# Patient Record
Sex: Female | Born: 1980 | Race: White | Hispanic: No | Smoking: Current every day smoker
Health system: Southern US, Community
[De-identification: ages and names within clinical notes are randomized; demographics above are authoritative.]

## PROBLEM LIST (undated history)

## (undated) DIAGNOSIS — Z87442 Personal history of urinary calculi: Secondary | ICD-10-CM

## (undated) DIAGNOSIS — F32A Depression, unspecified: Secondary | ICD-10-CM

## (undated) DIAGNOSIS — F319 Bipolar disorder, unspecified: Secondary | ICD-10-CM

## (undated) DIAGNOSIS — F209 Schizophrenia, unspecified: Secondary | ICD-10-CM

## (undated) DIAGNOSIS — D649 Anemia, unspecified: Secondary | ICD-10-CM

## (undated) DIAGNOSIS — F329 Major depressive disorder, single episode, unspecified: Secondary | ICD-10-CM

## (undated) HISTORY — PX: DIAGNOSTIC LAPAROSCOPY: SUR761

## (undated) HISTORY — PX: ABDOMINAL HYSTERECTOMY: SHX81

## (undated) HISTORY — PX: CHOLECYSTECTOMY: SHX55

---

## 2014-07-28 ENCOUNTER — Emergency Department: Payer: Self-pay | Admitting: Emergency Medicine

## 2014-09-01 ENCOUNTER — Emergency Department: Admit: 2014-09-01 | Discharge status: Against Medical Advice | Payer: Self-pay | Admitting: Emergency Medicine

## 2014-09-01 LAB — CBC
HCT: 40.8 % (ref 35.0–47.0)
HGB: 13.4 g/dL (ref 12.0–16.0)
MCH: 29.3 pg (ref 26.0–34.0)
MCHC: 33 g/dL (ref 32.0–36.0)
MCV: 89 fL (ref 80–100)
Platelet: 303 10*3/uL (ref 150–440)
RBC: 4.58 10*6/uL (ref 3.80–5.20)
RDW: 13.6 % (ref 11.5–14.5)
WBC: 12.1 10*3/uL — ABNORMAL HIGH (ref 3.6–11.0)

## 2014-09-01 LAB — COMPREHENSIVE METABOLIC PANEL
ANION GAP: 6 — AB (ref 7–16)
AST: 20 U/L
Albumin: 4 g/dL
Alkaline Phosphatase: 82 U/L
BUN: 12 mg/dL
Bilirubin,Total: 0.4 mg/dL
CALCIUM: 8.6 mg/dL — AB
CHLORIDE: 104 mmol/L
Co2: 26 mmol/L
Creatinine: 0.83 mg/dL
EGFR (Non-African Amer.): >60
GLUCOSE: 101 mg/dL — AB
Potassium: 3.8 mmol/L
SGPT (ALT): 26 U/L
Sodium: 136 mmol/L
Total Protein: 6.9 g/dL

## 2014-09-01 LAB — URINALYSIS, COMPLETE
BACTERIA: NONE SEEN
Bilirubin,UR: NEGATIVE
Blood: NEGATIVE
GLUCOSE, UR: NEGATIVE mg/dL (ref 0–75)
Ketone: NEGATIVE
Leukocyte Esterase: NEGATIVE
Nitrite: NEGATIVE
PROTEIN: NEGATIVE
Ph: 7 (ref 4.5–8.0)
SPECIFIC GRAVITY: 1.012 (ref 1.003–1.030)

## 2014-09-24 ENCOUNTER — Emergency Department
Admission: EM | Admit: 2014-09-24 | Discharge: 2014-09-24 | Disposition: A | Discharge status: Home / Self Care | Payer: Medicare Other | Attending: Emergency Medicine | Admitting: Emergency Medicine

## 2014-09-24 DIAGNOSIS — Z880 Allergy status to penicillin: Secondary | ICD-10-CM | POA: Insufficient documentation

## 2014-09-24 DIAGNOSIS — B029 Zoster without complications: Secondary | ICD-10-CM | POA: Diagnosis not present

## 2014-09-24 DIAGNOSIS — R21 Rash and other nonspecific skin eruption: Secondary | ICD-10-CM | POA: Diagnosis present

## 2014-09-24 MED ORDER — HYDROCODONE-ACETAMINOPHEN 5-325 MG PO TABS: 1.0000 | ORAL_TABLET | ORAL | Status: DC | PRN

## 2014-09-24 MED ORDER — ACYCLOVIR 400 MG PO TABS: 800.0000 mg | ORAL_TABLET | Freq: Every day | ORAL | Status: DC

## 2014-09-24 MED ORDER — BETAMETHASONE DIPROPIONATE AUG 0.05 % EX OINT: TOPICAL_OINTMENT | Freq: Two times a day (BID) | CUTANEOUS | Status: DC

## 2014-09-24 MED ORDER — METHYLPREDNISOLONE 4 MG PO TBPK: ORAL_TABLET | ORAL | Status: DC

## 2014-09-24 NOTE — ED Notes (Signed)
Pt states she started back on lamictal  Monday night and states yesterday she began to have red raised rash that is itching and buring, states rash has spread from neck to abd. area

## 2014-09-24 NOTE — ED Notes (Signed)
Restarted lamitical and now has scattered rash

## 2014-09-24 NOTE — Discharge Instructions (Signed)
Shingles Shingles (herpes zoster) is an infection that is caused by the same virus that causes chickenpox (varicella). The infection causes a painful skin rash and fluid-filled blisters, which eventually break open, crust over, and heal. It may occur in any area of the body, but it usually affects only one side of the body or face. The pain of shingles usually lasts about 1 month. However, some people with shingles may develop long-term (chronic) pain in the affected area of the body. Shingles often occurs many years after the person had chickenpox. It is more common:  In people older than 50 years.  In people with weakened immune systems, such as those with HIV, AIDS, or cancer.  In people taking medicines that weaken the immune system, such as transplant medicines.  In people under great stress. CAUSES  Shingles is caused by the varicella zoster virus (VZV), which also causes chickenpox. After a person is infected with the virus, it can remain in the person's body for years in an inactive state (dormant). To cause shingles, the virus reactivates and breaks out as an infection in a nerve root. The virus can be spread from person to person (contagious) through contact with open blisters of the shingles rash. It will only spread to people who have not had chickenpox. When these people are exposed to the virus, they may develop chickenpox. They will not develop shingles. Once the blisters scab over, the person is no longer contagious and cannot spread the virus to others. SIGNS AND SYMPTOMS  Shingles shows up in stages. The initial symptoms may be pain, itching, and tingling in an area of the skin. This pain is usually described as burning, stabbing, or throbbing.In a few days or weeks, a painful red rash will appear in the area where the pain, itching, and tingling were felt. The rash is usually on one side of the body in a band or belt-like pattern. Then, the rash usually turns into fluid-filled  blisters. They will scab over and dry up in approximately 2-3 weeks. Flu-like symptoms may also occur with the initial symptoms, the rash, or the blisters. These may include:  Fever.  Chills.  Headache.  Upset stomach. DIAGNOSIS  Your health care provider will perform a skin exam to diagnose shingles. Skin scrapings or fluid samples may also be taken from the blisters. This sample will be examined under a microscope or sent to a lab for further testing. TREATMENT  There is no specific cure for shingles. Your health care provider will likely prescribe medicines to help you manage the pain, recover faster, and avoid long-term problems. This may include antiviral drugs, anti-inflammatory drugs, and pain medicines. HOME CARE INSTRUCTIONS   Take a cool bath or apply cool compresses to the area of the rash or blisters as directed. This may help with the pain and itching.   Take medicines only as directed by your health care provider.   Rest as directed by your health care provider.  Keep your rash and blisters clean with mild soap and cool water or as directed by your health care provider.  Do not pick your blisters or scratch your rash. Apply an anti-itch cream or numbing creams to the affected area as directed by your health care provider.  Keep your shingles rash covered with a loose bandage (dressing).  Avoid skin contact with:  Babies.   Pregnant women.   Children with eczema.   Elderly people with transplants.   People with chronic illnesses, such as leukemia  or AIDS.   Wear loose-fitting clothing to help ease the pain of material rubbing against the rash.  Keep all follow-up visits as directed by your health care provider.If the area involved is on your face, you may receive a referral for a specialist, such as an eye doctor (ophthalmologist) or an ear, nose, and throat (ENT) doctor. Keeping all follow-up visits will help you avoid eye problems, chronic pain, or  disability.  SEEK IMMEDIATE MEDICAL CARE IF:   You have facial pain, pain around the eye area, or loss of feeling on one side of your face.  You have ear pain or ringing in your ear.  You have loss of taste.  Your pain is not relieved with prescribed medicines.   Your redness or swelling spreads.   You have more pain and swelling.  Your condition is worsening or has changed.   You have a fever. MAKE SURE YOU:  Understand these instructions.  Will watch your condition.  Will get help right away if you are not doing well or get worse. Document Released: 05/09/2005 Document Revised: 09/23/2013 Document Reviewed: 12/22/2011 [facility] Patient Information 2015 [place]. This information is not intended to replace advice given to you by your health care provider. Make sure you discuss any questions you have with your health care provider.   You have an outbreak of shingles.  It has happened because you have had chicken pox as a child.  You may have had this flare, because we think, sometimes the virus shows up at times when the body is stressed.  For you, this may have happened because you were experiencing withdrawal symptoms from your Lamictal.  You are not contagious to anyone.  Take the prescription meds as directed.  Your rash will get worse, before it gets better.  [facility].

## 2014-09-24 NOTE — ED Provider Notes (Signed)
[facility] Emergency Department Provider Note   ____________________________________________  Time seen: 1429 I have reviewed the triage vital signs and the nursing notes.   HISTORY  Chief Complaint Medication Reaction  HPI Taylor SpurrMaria Fleming is a 34 y.o. female who recently relocated to the area from out of town. She is here because she started her Lamictal, on Monday after being out for about a week. She describes she had begun to experience some withdrawal symptoms, including nausea, vomiting, and diarrhea.She is here because she awoke with a painful burning rash to the left neck 1 day prior to arrival. She called her provider who assumed she was having a medicine reaction to the Lamictal, and advised that she discontinue use immediately. She is here today for evaluation of rash.   No past medical history on file.  There are no active problems to display for this patient.   No past surgical history on file.  Current Outpatient Rx  Name  Route  Sig  Dispense  Refill  . acyclovir (ZOVIRAX) 400 MG tablet   Oral   Take 2 tablets (800 mg total) by mouth 5 (five) times daily.   70 tablet   0   . augmented betamethasone dipropionate (DIPROLENE-AF) 0.05 % ointment   Topical   Apply topically 2 (two) times daily.   30 g   0   . HYDROcodone-acetaminophen (NORCO) 5-325 MG per tablet   Oral   Take 1 tablet by mouth every 4 (four) hours as needed for moderate pain.   6 tablet   0   . methylPREDNISolone (MEDROL DOSEPAK) 4 MG TBPK tablet      Dose pack as directed   21 tablet   0     Allergies Penicillins and Ibuprofen  No family history on file.  Social History History  Substance Use Topics  . Smoking status: Not on file  . Smokeless tobacco: Not on file  . Alcohol Use: Not on file    Review of Systems Constitutional: Negative for fever. Eyes: Negative for visual changes. ENT: Negative for sore throat. Cardiovascular: Negative for chest  pain. Respiratory: Negative for shortness of breath. Gastrointestinal: Negative for abdominal pain, vomiting and diarrhea. Musculoskeletal: Negative for back pain. Skin: Positive for rash. Neurological: Negative for headaches, focal weakness or numbness.  10-point ROS otherwise negative.  ____________________________________________   PHYSICAL EXAM:  VITAL SIGNS: ED Triage Vitals  Enc Vitals Group     BP 09/24/14 1242 133/76 mmHg     Pulse Rate 09/24/14 1242 88     Resp --      Temp 09/24/14 1242 98.4 F (36.9 C)     Temp Source 09/24/14 1242 Oral     SpO2 09/24/14 1242 98 %     Weight 09/24/14 1242 185 lb (83.915 kg)     Height 09/24/14 1242 5\' 1"  (1.549 m)     Head Cir --      Peak Flow --      Pain Score 09/24/14 1242 2     Pain Loc --      Pain Edu? --      Excl. in GC? --     Constitutional: Alert and oriented. Well appearing and in no distress. Eyes: Conjunctivae are normal. PERRL. Normal extraocular movements. ENT   Head: Normocephalic and atraumatic.   Nose: No congestion/rhinnorhea.   Mouth/Throat: Mucous membranes are moist.   Neck: No stridor. Hematological/Lymphatic/Immunilogical: No cervical lymphadenopathy. Cardiovascular: Normal rate, regular rhythm. Normal and symmetric distal  pulses are present in all extremities. No murmurs, rubs, or gallops. Respiratory: Normal respiratory effort without tachypnea nor retractions.  Musculoskeletal: Nontender with normal range of motion in all extremities. No joint effusions.  No lower extremity tenderness nor edema. Neurologic:  Normal speech and language. No gross focal neurologic deficits are appreciated. Speech is normal. No gait instability. Skin:  Skin is warm, dry and intact. Patient with a erythematous rash the left lateral neck. Noted is overlying pustules. The rash appears to be unilateral, and involving a single dermatome. Psychiatric: Mood and affect are normal. Speech and behavior are normal.  Patient exhibits appropriate insight and judgment. ____________________________________________   PROCEDURES  Procedure(s) performed: None  Critical Care performed: No  ____________________________________________   INITIAL IMPRESSION / ASSESSMENT AND PLAN / ED COURSE  Pertinent labs & imaging results that were available during my care of the patient were reviewed by me and considered in my medical decision making (see chart for details).  ___________________________________________   FINAL CLINICAL IMPRESSION(S) / ED DIAGNOSES  Final diagnoses:  Shingles    Lissa HoardJenise V Bacon Ellsie Violette, PA-C 09/24/14 1521  Governor Rooksebecca Lord, MD 09/26/14 [phone]

## 2014-09-24 NOTE — ED Notes (Signed)
Pt states she is also having left jaw pain that started this am

## 2014-11-14 ENCOUNTER — Emergency Department
Admission: EM | Admit: 2014-11-14 | Discharge: 2014-11-14 | Disposition: A | Discharge status: Home / Self Care | Payer: Medicare Other | Attending: Emergency Medicine | Admitting: Emergency Medicine

## 2014-11-14 ENCOUNTER — Encounter: Payer: Self-pay | Admitting: Emergency Medicine

## 2014-11-14 DIAGNOSIS — Z880 Allergy status to penicillin: Secondary | ICD-10-CM | POA: Insufficient documentation

## 2014-11-14 DIAGNOSIS — F22 Delusional disorders: Secondary | ICD-10-CM | POA: Insufficient documentation

## 2014-11-14 DIAGNOSIS — F329 Major depressive disorder, single episode, unspecified: Secondary | ICD-10-CM | POA: Insufficient documentation

## 2014-11-14 DIAGNOSIS — R443 Hallucinations, unspecified: Secondary | ICD-10-CM

## 2014-11-14 DIAGNOSIS — Z720 Tobacco use: Secondary | ICD-10-CM | POA: Insufficient documentation

## 2014-11-14 DIAGNOSIS — F3130 Bipolar disorder, current episode depressed, mild or moderate severity, unspecified: Secondary | ICD-10-CM

## 2014-11-14 DIAGNOSIS — F32A Depression, unspecified: Secondary | ICD-10-CM

## 2014-11-14 HISTORY — DX: Depression, unspecified: F32.A

## 2014-11-14 HISTORY — DX: Major depressive disorder, single episode, unspecified: F32.9

## 2014-11-14 HISTORY — DX: Bipolar disorder, unspecified: F31.9

## 2014-11-14 HISTORY — DX: Schizophrenia, unspecified: F20.9

## 2014-11-14 LAB — URINE DRUG SCREEN, QUALITATIVE (ARMC ONLY)
Amphetamines, Ur Screen: NOT DETECTED
BARBITURATES, UR SCREEN: NOT DETECTED
Benzodiazepine, Ur Scrn: NOT DETECTED
CANNABINOID 50 NG, UR ~~LOC~~: NOT DETECTED
COCAINE METABOLITE, UR ~~LOC~~: NOT DETECTED
MDMA (ECSTASY) UR SCREEN: NOT DETECTED
Methadone Scn, Ur: NOT DETECTED
OPIATE, UR SCREEN: NOT DETECTED
Phencyclidine (PCP) Ur S: NOT DETECTED
Tricyclic, Ur Screen: NOT DETECTED

## 2014-11-14 LAB — COMPREHENSIVE METABOLIC PANEL
ALT: 17 U/L (ref 14–54)
AST: 16 U/L (ref 15–41)
Albumin: 4.3 g/dL (ref 3.5–5.0)
Alkaline Phosphatase: 89 U/L (ref 38–126)
Anion gap: 8 (ref 5–15)
BILIRUBIN TOTAL: 0.5 mg/dL (ref 0.3–1.2)
BUN: 12 mg/dL (ref 6–20)
CHLORIDE: 106 mmol/L (ref 101–111)
CO2: 23 mmol/L (ref 22–32)
Calcium: 9 mg/dL (ref 8.9–10.3)
Creatinine, Ser: 0.71 mg/dL (ref 0.44–1.00)
GFR calc Af Amer: >60 mL/min (ref >60)
GFR calc non Af Amer: >60 mL/min (ref >60)
Glucose, Bld: 108 mg/dL — ABNORMAL HIGH (ref 65–99)
POTASSIUM: 3.9 mmol/L (ref 3.5–5.1)
SODIUM: 137 mmol/L (ref 135–145)
TOTAL PROTEIN: 7.6 g/dL (ref 6.5–8.1)

## 2014-11-14 LAB — CBC
HCT: 41.7 % (ref 35.0–47.0)
Hemoglobin: 14.1 g/dL (ref 12.0–16.0)
MCH: 30.4 pg (ref 26.0–34.0)
MCHC: 33.7 g/dL (ref 32.0–36.0)
MCV: 90.1 fL (ref 80.0–100.0)
Platelets: 280 10*3/uL (ref 150–440)
RBC: 4.63 MIL/uL (ref 3.80–5.20)
RDW: 13.6 % (ref 11.5–14.5)
WBC: 10.1 10*3/uL (ref 3.6–11.0)

## 2014-11-14 MED ORDER — LORAZEPAM 1 MG PO TABS
ORAL_TABLET | ORAL | Status: AC
  Administered 2014-11-14: 1 mg via ORAL
  Filled 2014-11-14: qty 1

## 2014-11-14 MED ORDER — LORAZEPAM 1 MG PO TABS
1.0000 mg | ORAL_TABLET | Freq: Once | ORAL | Status: AC
  Administered 2014-11-14: 1 mg via ORAL

## 2014-11-14 NOTE — ED Notes (Signed)
Call to patient's uncle Taylor Fleming [phone]. Per patient, asked that he head home instead of waiting for her today.

## 2014-11-14 NOTE — ED Notes (Signed)
Pt speaking with counselor.  Pt is calm and cooperative.  In paperscrubs

## 2014-11-14 NOTE — Consult Note (Signed)
[facility] Consult   Reason for Consult:  Consult for 34 year old woman with history of bipolar disorder who feels like her symptoms are worse Referring Physician:  Archie Balboa Patient Identification: Cobie Leidner MRN:  [id number] Principal Diagnosis: Bipolar disorder with depression Diagnosis:   Patient Active Problem List   Diagnosis Date Noted  . Bipolar disorder with depression [F31.30] 11/14/2014  . Insomnia [G47.00] 11/14/2014    Total Time spent with patient: 1 hour  Subjective:   Taylor Fleming is a 34 y.o. female patient admitted with "I needed to see somebody before the 30th". Patient says that she is having worsening of her symptoms and felt like she needed to be seen rather than waiting to go to her outpatient doctor.  HPI:  Current symptoms include physical symptoms of diarrhea of vomiting dizziness and falling. She is also having more anxious mood. Has been having more auditory hallucinations. More visual hallucinations. These are things have all been going on for the past one month. She denies that she's having any suicidal thoughts. She is sleeping worse. She feels nervous a lot of the time. Major stress is worrying about money. The patient blames her change in symptoms on her current medicine. She is now taking 50 mg of trazodone at night and taking citalopram for the past month. She thinks both of these changes have been a problem for her.  Past psychiatric history: Long history of mental health problems dating from teenage years. Multiple suicide attempts as a teenager. Last suicide attempt age 51. Multiple hospitalizations with the last one about a year ago in [place]. Multiple medications including Seroquel Wellbutrin led to the lithium and Prozac. Many of them cause problems side effects. Diagnosis has been bipolar or schizoaffective disorder according to her.  Social history: Patient gets disability. Currently lives with an aunt and uncle. She is only  lived in [place] less than a year. She has an 60 year old son who lives with her. Worries a lot about money.  Medical history: No significant known medical problems.  Substance abuse history: Only drinks alcohol occasionally. Not currently using his drugs. Says that she used to use a lot of marijuana but stopped a few years ago.  Family history: Extensive family history. Her mother committed suicide HPI Elements:   Quality:  Anxiety poor sleep agitation increased hallucinations. Severity:  Severe and disruptive. Timing:  Been going on for the last month to month and a half. Duration:  Symptoms are mostly ongoing. Context:  Recent changes in medicine as well as recent relocation and financial burden.  Past Medical History:  Past Medical History  Diagnosis Date  . Depression   . Bipolar 1 disorder   . Schizophrenia     Past Surgical History  Procedure Laterality Date  . Cholecystectomy     Family History: History reviewed. No pertinent family history. Social History:  History  Alcohol Use  . Yes     History  Drug Use No    History   Social History  . Marital Status: Legally Separated    Spouse Name: N/A  . Number of Children: N/A  . Years of Education: N/A   Social History Main Topics  . Smoking status: Current Every Day Smoker  . Smokeless tobacco: Not on file  . Alcohol Use: Yes  . Drug Use: No  . Sexual Activity: Not on file   Other Topics Concern  . None   Social History Narrative  . None   Additional Social History:  Allergies:   Allergies  Allergen Reactions  . Penicillins Swelling  . Ibuprofen Rash    Labs:  Results for orders placed or performed during the hospital encounter of 11/14/14 (from the past 48 hour(s))  CBC     Status: None   Collection Time: 11/14/14 11:42 AM  Result Value Ref Range   WBC 10.1 3.6 - 11.0 K/uL   RBC 4.63 3.80 - 5.20 MIL/uL   Hemoglobin 14.1 12.0 - 16.0 g/dL   HCT 41.7  35.0 - 47.0 %   MCV 90.1 80.0 - 100.0 fL   MCH 30.4 26.0 - 34.0 pg   MCHC 33.7 32.0 - 36.0 g/dL   RDW 13.6 11.5 - 14.5 %   Platelets 280 150 - 440 K/uL  Comprehensive metabolic panel     Status: Abnormal   Collection Time: 11/14/14 11:42 AM  Result Value Ref Range   Sodium 137 135 - 145 mmol/L   Potassium 3.9 3.5 - 5.1 mmol/L   Chloride 106 101 - 111 mmol/L   CO2 23 22 - 32 mmol/L   Glucose, Bld 108 (H) 65 - 99 mg/dL   BUN 12 6 - 20 mg/dL   Creatinine, Ser 0.71 0.44 - 1.00 mg/dL   Calcium 9.0 8.9 - 10.3 mg/dL   Total Protein 7.6 6.5 - 8.1 g/dL   Albumin 4.3 3.5 - 5.0 g/dL   AST 16 15 - 41 U/L   ALT 17 14 - 54 U/L   Alkaline Phosphatase 89 38 - 126 U/L   Total Bilirubin 0.5 0.3 - 1.2 mg/dL   GFR calc non Af Amer >60 >60 mL/min   GFR calc Af Amer >60 >60 mL/min    Comment: (NOTE) The eGFR has been calculated using the CKD EPI equation. This calculation has not been validated in all clinical situations. eGFR's persistently <60 mL/min signify possible Chronic Kidney Disease.    Anion gap 8 5 - 15  Urine Drug Screen, Qualitative ([facility] only)     Status: None   Collection Time: 11/14/14 11:43 AM  Result Value Ref Range   Tricyclic, Ur Screen NONE DETECTED NONE DETECTED   Amphetamines, Ur Screen NONE DETECTED NONE DETECTED   MDMA (Ecstasy)Ur Screen NONE DETECTED NONE DETECTED   Cocaine Metabolite,Ur [place] NONE DETECTED NONE DETECTED   Opiate, Ur Screen NONE DETECTED NONE DETECTED   Phencyclidine (PCP) Ur S NONE DETECTED NONE DETECTED   Cannabinoid 50 Ng, Ur [place] NONE DETECTED NONE DETECTED   Barbiturates, Ur Screen NONE DETECTED NONE DETECTED   Benzodiazepine, Ur Scrn NONE DETECTED NONE DETECTED   Methadone Scn, Ur NONE DETECTED NONE DETECTED    Comment: (NOTE) 606  Tricyclics, urine               Cutoff 1000 ng/mL 200  Amphetamines, urine             Cutoff 1000 ng/mL 300  MDMA (Ecstasy), urine           Cutoff 500 ng/mL 400  Cocaine Metabolite, urine       Cutoff 300 ng/mL 500   Opiate, urine                   Cutoff 300 ng/mL 600  Phencyclidine (PCP), urine      Cutoff 25 ng/mL 700  Cannabinoid, urine              Cutoff 50 ng/mL 800  Barbiturates, urine  Cutoff 200 ng/mL 900  Benzodiazepine, urine           Cutoff 200 ng/mL 1000 Methadone, urine                Cutoff 300 ng/mL 1100 1200 The urine drug screen provides only a preliminary, unconfirmed 1300 analytical test result and should not be used for non-medical 1400 purposes. Clinical consideration and professional judgment should 1500 be applied to any positive drug screen result due to possible 1600 interfering substances. A more specific alternate chemical method 1700 must be used in order to obtain a confirmed analytical result.  1800 Gas chromato graphy / mass spectrometry (GC/MS) is the preferred 1900 confirmatory method.     Vitals: Blood pressure 123/73, pulse 84, temperature 98.5 F (36.9 C), temperature source Oral, resp. rate 20, height 5' 1"  (1.549 m), weight 83.915 kg (185 lb), last menstrual period 10/30/2014, SpO2 97 %.  Risk to Self: Is patient at risk for suicide?: Yes Risk to Others:   Prior Inpatient Therapy:   Prior Outpatient Therapy:    No current facility-administered medications for this encounter.   Current Outpatient Prescriptions  Medication Sig Dispense Refill  . acyclovir (ZOVIRAX) 400 MG tablet Take 2 tablets (800 mg total) by mouth 5 (five) times daily. 70 tablet 0  . augmented betamethasone dipropionate (DIPROLENE-AF) 0.05 % ointment Apply topically 2 (two) times daily. 30 g 0  . HYDROcodone-acetaminophen (NORCO) 5-325 MG per tablet Take 1 tablet by mouth every 4 (four) hours as needed for moderate pain. 6 tablet 0  . methylPREDNISolone (MEDROL DOSEPAK) 4 MG TBPK tablet Dose pack as directed 21 tablet 0    Musculoskeletal: Strength & Muscle Tone: within normal limits Gait & Station: normal Patient leans: N/A  Psychiatric Specialty Exam: Physical Exam   Constitutional: She appears well-developed and well-nourished.  HENT:  Head: Normocephalic and atraumatic.  Eyes: Conjunctivae are normal. Pupils are equal, round, and reactive to light.  Neck: Normal range of motion.  Cardiovascular: Normal heart sounds.   Respiratory: Effort normal.  GI: Soft.  Musculoskeletal: Normal range of motion.  Neurological: She is alert.  Skin: Skin is warm and dry.  Psychiatric: She has a normal mood and affect. Her speech is normal and behavior is normal. Judgment and thought content normal. She exhibits abnormal recent memory.  Patient is neatly groomed and appropriately cooperative. Affect only slightly anxious. Overall appropriate. No sign of psychosis. No agitation or behavior disturbance    Review of Systems  Constitutional: Negative.   HENT: Negative.   Eyes: Negative.   Respiratory: Negative.   Cardiovascular: Negative.   Gastrointestinal: Positive for vomiting and diarrhea.  Musculoskeletal: Negative.   Skin: Negative.   Neurological: Negative.   Psychiatric/Behavioral: Positive for hallucinations. Negative for depression, suicidal ideas, memory loss and substance abuse. The patient is nervous/anxious and has insomnia.     Blood pressure 123/73, pulse 84, temperature 98.5 F (36.9 C), temperature source Oral, resp. rate 20, height 5' 1"  (1.549 m), weight 83.915 kg (185 lb), last menstrual period 10/30/2014, SpO2 97 %.Body mass index is 34.97 kg/(m^2).  General Appearance: Fairly Groomed  Engineer, water::  Good  Speech:  Clear and Coherent  Volume:  Normal  Mood:  Anxious  Affect:  Blunt  Thought Process:  Circumstantial  Orientation:  Full (Time, Place, and Person)  Thought Content:  Hallucinations: Auditory Visual  Suicidal Thoughts:  No  Homicidal Thoughts:  No  Memory:  Immediate;   Good Recent;   Fair Remote;  Good  Judgement:  Fair  Insight:  Fair  Psychomotor Activity:  Normal  Concentration:  Fair  Recall:  [employer] of  Knowledge:Fair  Language: Good  Akathisia:  No  Handed:  Right  AIMS (if indicated):     Assets:  Communication Skills Desire for Improvement Financial Resources/Insurance Housing Physical Health Social Support  ADL's:  Intact  Cognition: WNL  Sleep:      Medical Decision Making: Review of Psycho-Social Stressors (1), Review or order clinical lab tests (1), Established Problem, Worsening (2), Review of Last Therapy Session (1), Review of Medication Regimen & Side Effects (2) and Review of New Medication or Change in Dosage (2)  Treatment Plan Summary: Medication management and Plan Patient currently is not endorsing suicidal or homicidal ideation. Although she endorses hallucinations she is not delusional and does not appear to be grossly psychotic. She is able to take care of herself adequately. Patient does not meet criteria for hospitalization. We discussed medication changes. She would like to discontinue the citalopram and increase her trazodoneto 100 mg at night. Both of those seemed reasonable to me. She is also taking Rexalti 2 mg a day and she would like to continue with that dose. I wrote her no new prescriptions. Supportive and educational counseling completed. Case discussed with the ER doctor. IVC discontinued and she can be discharged with follow up at [facility]:  Patient does not meet criteria for psychiatric inpatient admission. Supportive therapy provided about ongoing stressors. Disposition: No new prescriptions written. Discussed medication management. Encouraged her to follow-up with [facility]. She can be released from the emergency room.  Kayia Billinger 11/14/2014 3:17 PM

## 2014-11-14 NOTE — Discharge Instructions (Signed)
Please seek medical attention and help for any thoughts about wanting to harm herself, harm others, any concerning change in behavior, severe depression, inappropriate drug use or any other new or concerning symptoms.  Depression Depression refers to feeling sad, low, down in the dumps, blue, gloomy, or empty. In general, there are two kinds of depression: 1. Normal sadness or normal grief. This kind of depression is one that we all feel from time to time after upsetting life experiences, such as the loss of a job or the ending of a relationship. This kind of depression is considered normal, is short lived, and resolves within a few days to 2 weeks. Depression experienced after the loss of a loved one (bereavement) often lasts longer than 2 weeks but normally gets better with time. 2. Clinical depression. This kind of depression lasts longer than normal sadness or normal grief or interferes with your ability to function at home, at work, and in school. It also interferes with your personal relationships. It affects almost every aspect of your life. Clinical depression is an illness. Symptoms of depression can also be caused by conditions other than those mentioned above, such as:  Physical illness. Some physical illnesses, including underactive thyroid gland (hypothyroidism), severe anemia, specific types of cancer, diabetes, uncontrolled seizures, heart and lung problems, strokes, and chronic pain are commonly associated with symptoms of depression.  Side effects of some prescription medicine. In some people, certain types of medicine can cause symptoms of depression.  Substance abuse. Abuse of alcohol and illicit drugs can cause symptoms of depression. SYMPTOMS Symptoms of normal sadness and normal grief include the following:  Feeling sad or crying for short periods of time.  Not caring about anything (apathy).  Difficulty sleeping or sleeping too much.  No longer able to enjoy the things  you used to enjoy.  Desire to be by oneself all the time (social isolation).  Lack of energy or motivation.  Difficulty concentrating or remembering.  Change in appetite or weight.  Restlessness or agitation. Symptoms of clinical depression include the same symptoms of normal sadness or normal grief and also the following symptoms:  Feeling sad or crying all the time.  Feelings of guilt or worthlessness.  Feelings of hopelessness or helplessness.  Thoughts of suicide or the desire to harm yourself (suicidal ideation).  Loss of touch with reality (psychotic symptoms). Seeing or hearing things that are not real (hallucinations) or having false beliefs about your life or the people around you (delusions and paranoia). DIAGNOSIS  The diagnosis of clinical depression is usually based on how bad the symptoms are and how long they have lasted. Your health care provider will also ask you questions about your medical history and substance use to find out if physical illness, use of prescription medicine, or substance abuse is causing your depression. Your health care provider may also order blood tests. TREATMENT  Often, normal sadness and normal grief do not require treatment. However, sometimes antidepressant medicine is given for bereavement to ease the depressive symptoms until they resolve. The treatment for clinical depression depends on how bad the symptoms are but often includes antidepressant medicine, counseling with a mental health professional, or both. Your health care provider will help to determine what treatment is best for you. Depression caused by physical illness usually goes away with appropriate medical treatment of the illness. If prescription medicine is causing depression, talk with your health care provider about stopping the medicine, decreasing the dose, or changing to another medicine.  Depression caused by the abuse of alcohol or illicit drugs goes away when you stop  using these substances. Some adults need professional help in order to stop drinking or using drugs. SEEK IMMEDIATE MEDICAL CARE IF:  You have thoughts about hurting yourself or others.  You lose touch with reality (have psychotic symptoms).  You are taking medicine for depression and have a serious side effect. FOR MORE INFORMATION  National Alliance on Mental Illness: www.nami.[employer] of Mental Health: http://www.maynard.net/ Document Released: 05/06/2000 Document Revised: 09/23/2013 Document Reviewed: 08/08/2011 [facility] Patient Information 2015 [place], [place]. This information is not intended to replace advice given to you by your health care provider. Make sure you discuss any questions you have with your health care provider.

## 2014-11-14 NOTE — ED Notes (Signed)
Call to patient's uncle Homero Fellers, will be bringing patients clothing to ED before d/c today.

## 2014-11-14 NOTE — ED Notes (Signed)
Pt reports since beginning Celexa 1 month+ ago she has had paranoia, SI, hallucinations, hearing voces and foggy feeling in her head. Pt admits to SI two days, when driving she thought about jumping out of the vehicle. Denies HI. States her aunt and uncle whom she lives with wanted her to see her doctor but she couldn't get in before 4 weeks so they brought her to the ED today. Pt gave all her belongings to her family.

## 2014-11-14 NOTE — ED Notes (Signed)
Pt to ed with c/o depression, SI, paranoia, and falls since changing meds (to citalopram) about 1 1/2 months ago.  Pt states she called [facility] but that they are unable to see her for 4 weeks.  Pt Denies plan of suicide at this time.

## 2014-11-14 NOTE — ED Provider Notes (Signed)
[facility] Emergency Department Provider Note  ____________________________________________  Time seen: 1240  I have reviewed the triage vital signs and the nursing notes.   HISTORY  Chief Complaint Depression; Hallucinations; and Paranoid   History limited by: Not Limited   HPI Taylor Fleming is a 34 y.o. female with history of bipolar with schizoaffective tendencies who presents to the emergency department today with concerns for depression, hallucinations and suicidal ideations. The patient states that the symptoms have been going on for 1 month. They've been waxing and waning. There were severe 2 days ago. She was driving in a car when she thought about jumping out the door on the highway to hurt herself. She says the voices are telling to hurt herself. She denies any thoughts of wanting to hurt others. She states she thinks it is the Celexa that is causing's. She started this medication 1 month ago. She tried getting in touch with her psychiatrist however they will be able to see her for 4 weeks.   Past Medical History  Diagnosis Date  . Depression   . Bipolar 1 disorder   . Schizophrenia     There are no active problems to display for this patient.   Past Surgical History  Procedure Laterality Date  . Cholecystectomy      Current Outpatient Rx  Name  Route  Sig  Dispense  Refill  . acyclovir (ZOVIRAX) 400 MG tablet   Oral   Take 2 tablets (800 mg total) by mouth 5 (five) times daily.   70 tablet   0   . augmented betamethasone dipropionate (DIPROLENE-AF) 0.05 % ointment   Topical   Apply topically 2 (two) times daily.   30 g   0   . HYDROcodone-acetaminophen (NORCO) 5-325 MG per tablet   Oral   Take 1 tablet by mouth every 4 (four) hours as needed for moderate pain.   6 tablet   0   . methylPREDNISolone (MEDROL DOSEPAK) 4 MG TBPK tablet      Dose pack as directed   21 tablet   0     Allergies Penicillins and  Ibuprofen  History reviewed. No pertinent family history.  Social History History  Substance Use Topics  . Smoking status: Current Every Day Smoker  . Smokeless tobacco: Not on file  . Alcohol Use: Yes    Review of Systems  Constitutional: Negative for fever. Cardiovascular: Negative for chest pain. Respiratory: Negative for shortness of breath. Gastrointestinal: Negative for abdominal pain, vomiting and diarrhea. Genitourinary: Negative for dysuria. Musculoskeletal: Negative for back pain. Skin: Negative for rash. Neurological: Negative for headaches, focal weakness or numbness.  10-point ROS otherwise negative.  ____________________________________________   PHYSICAL EXAM:  VITAL SIGNS: ED Triage Vitals  Enc Vitals Group     BP 11/14/14 1138 123/73 mmHg     Pulse Rate 11/14/14 1138 84     Resp 11/14/14 1138 20     Temp 11/14/14 1138 98.5 F (36.9 C)     Temp Source 11/14/14 1138 Oral     SpO2 11/14/14 1138 97 %     Weight 11/14/14 1138 185 lb (83.915 kg)     Height 11/14/14 1138 5\' 1"  (1.549 m)     Head Cir --      Peak Flow --      Pain Score 11/14/14 1139 0   Constitutional: Alert and oriented. Well appearing and in no distress. Eyes: Conjunctivae are normal. PERRL. Normal extraocular movements. ENT  Head: Normocephalic and atraumatic.   Nose: No congestion/rhinnorhea.   Mouth/Throat: Mucous membranes are moist.   Neck: No stridor. Hematological/Lymphatic/Immunilogical: No cervical lymphadenopathy. Cardiovascular: Normal rate, regular rhythm.  No murmurs, rubs, or gallops. Respiratory: Normal respiratory effort without tachypnea nor retractions. Breath sounds are clear and equal bilaterally. No wheezes/rales/rhonchi. Gastrointestinal: Soft and nontender. No distention.  Genitourinary: Deferred Musculoskeletal: Normal range of motion in all extremities. No joint effusions.  No lower extremity tenderness nor edema. Neurologic:  Normal speech  and language. No gross focal neurologic deficits are appreciated. Speech is normal.  Skin:  Skin is warm, dry and intact. No rash noted. Psychiatric: Patient does endorse suicidal ideation and depression. No obvious response to any internal stimuli. ____________________________________________    LABS (pertinent positives/negatives)  Labs Reviewed  COMPREHENSIVE METABOLIC PANEL - Abnormal; Notable for the following:    Glucose, Bld 108 (*)    All other components within normal limits  CBC  URINE DRUG SCREEN, QUALITATIVE ([facility] ONLY)  POC URINE PREG, ED     ____________________________________________   EKG  None  ____________________________________________    RADIOLOGY  None  ____________________________________________   PROCEDURES  Procedure(s) performed: None  Critical Care performed: No  ____________________________________________   INITIAL IMPRESSION / ASSESSMENT AND PLAN / ED COURSE  Pertinent labs & imaging results that were available during my care of the patient were reviewed by me and considered in my medical decision making (see chart for details).  Patient here with depression, hallucinations and concern for SI. Patient does state she has a plan. On exam patient appears somewhat depressed. Given admission of suicidal ideation with plan will place patient under IVC.  ----------------------------------------- 3:13 PM on 11/14/2014 -----------------------------------------  Psychiatry has seen the patient. They feel she is safe for discharge home. She will follow up with [facility]. Psychiatrist did discuss medication adjustments with patient.  ____________________________________________   FINAL CLINICAL IMPRESSION(S) / ED DIAGNOSES  Final diagnoses:  Depression  Hallucination     Phineas Semen, MD 11/14/14 1513
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
## 2014-11-14 NOTE — ED Notes (Signed)

## 2014-11-14 NOTE — ED Notes (Signed)
ENVIRONMENTAL ASSESSMENT Potentially harmful objects out of patient reach: Yes.   Personal belongings secured: Yes.  n/a pt has not belongings  Patient dressed in hospital provided attire only: Yes.   Plastic bags out of patient reach: Yes.   Patient care equipment (cords, cables, call bells, lines, and drains) shortened, removed, or accounted for: Yes.   Equipment and supplies removed from bottom of stretcher: Yes.   Potentially toxic materials out of patient reach: Yes.   Sharps container removed or out of patient reach: Yes.

## 2014-11-14 NOTE — ED Notes (Signed)
BEHAVIORAL HEALTH ROUNDING Patient sleeping: No. Patient alert and oriented: yes Behavior appropriate: Yes.  ; If no, describe:  Nutrition and fluids offered: Yes  Toileting and hygiene offered: Yes  Sitter present:no Law enforcement present: yes 

## 2014-11-17 ENCOUNTER — Emergency Department: Payer: Medicare Other

## 2014-11-17 ENCOUNTER — Encounter: Payer: Self-pay | Admitting: Emergency Medicine

## 2014-11-17 ENCOUNTER — Other Ambulatory Visit: Payer: Self-pay

## 2014-11-17 ENCOUNTER — Emergency Department
Admission: EM | Admit: 2014-11-17 | Discharge: 2014-11-17 | Disposition: A | Discharge status: Home / Self Care | Payer: Medicare Other | Attending: Emergency Medicine | Admitting: Emergency Medicine

## 2014-11-17 DIAGNOSIS — R197 Diarrhea, unspecified: Secondary | ICD-10-CM | POA: Diagnosis not present

## 2014-11-17 DIAGNOSIS — Z720 Tobacco use: Secondary | ICD-10-CM | POA: Diagnosis not present

## 2014-11-17 DIAGNOSIS — Z3202 Encounter for pregnancy test, result negative: Secondary | ICD-10-CM | POA: Diagnosis not present

## 2014-11-17 DIAGNOSIS — R112 Nausea with vomiting, unspecified: Secondary | ICD-10-CM | POA: Diagnosis present

## 2014-11-17 DIAGNOSIS — R55 Syncope and collapse: Secondary | ICD-10-CM | POA: Insufficient documentation

## 2014-11-17 DIAGNOSIS — R079 Chest pain, unspecified: Secondary | ICD-10-CM | POA: Insufficient documentation

## 2014-11-17 DIAGNOSIS — Z880 Allergy status to penicillin: Secondary | ICD-10-CM | POA: Insufficient documentation

## 2014-11-17 DIAGNOSIS — Z79899 Other long term (current) drug therapy: Secondary | ICD-10-CM | POA: Insufficient documentation

## 2014-11-17 DIAGNOSIS — F419 Anxiety disorder, unspecified: Secondary | ICD-10-CM | POA: Insufficient documentation

## 2014-11-17 LAB — CBC WITH DIFFERENTIAL/PLATELET
BASOS PCT: 1 %
Basophils Absolute: 0.1 10*3/uL (ref 0–0.1)
Eosinophils Absolute: 0.3 10*3/uL (ref 0–0.7)
Eosinophils Relative: 2 %
HCT: 37.5 % (ref 35.0–47.0)
HEMOGLOBIN: 12.7 g/dL (ref 12.0–16.0)
Lymphocytes Relative: 21 %
Lymphs Abs: 4.3 10*3/uL — ABNORMAL HIGH (ref 1.0–3.6)
MCH: 30.6 pg (ref 26.0–34.0)
MCHC: 33.8 g/dL (ref 32.0–36.0)
MCV: 90.5 fL (ref 80.0–100.0)
MONOS PCT: 6 %
Monocytes Absolute: 1.3 10*3/uL — ABNORMAL HIGH (ref 0.2–0.9)
Neutro Abs: 14.1 10*3/uL — ABNORMAL HIGH (ref 1.4–6.5)
Neutrophils Relative %: 70 %
Platelets: 250 10*3/uL (ref 150–440)
RBC: 4.15 MIL/uL (ref 3.80–5.20)
RDW: 13.9 % (ref 11.5–14.5)
WBC: 20.1 10*3/uL — ABNORMAL HIGH (ref 3.6–11.0)

## 2014-11-17 LAB — URINALYSIS COMPLETE WITH MICROSCOPIC (ARMC ONLY)
BILIRUBIN URINE: NEGATIVE
Glucose, UA: NEGATIVE mg/dL
HGB URINE DIPSTICK: NEGATIVE
Leukocytes, UA: NEGATIVE
Nitrite: NEGATIVE
PH: 5 (ref 5.0–8.0)
Protein, ur: NEGATIVE mg/dL
SPECIFIC GRAVITY, URINE: 1.034 — AB (ref 1.005–1.030)

## 2014-11-17 LAB — COMPREHENSIVE METABOLIC PANEL
ALBUMIN: 3.7 g/dL (ref 3.5–5.0)
ALT: 17 U/L (ref 14–54)
ANION GAP: 9 (ref 5–15)
AST: 14 U/L — ABNORMAL LOW (ref 15–41)
Alkaline Phosphatase: 69 U/L (ref 38–126)
BILIRUBIN TOTAL: 0.2 mg/dL — AB (ref 0.3–1.2)
BUN: 13 mg/dL (ref 6–20)
CHLORIDE: 106 mmol/L (ref 101–111)
CO2: 23 mmol/L (ref 22–32)
Calcium: 8.6 mg/dL — ABNORMAL LOW (ref 8.9–10.3)
Creatinine, Ser: 0.69 mg/dL (ref 0.44–1.00)
GFR calc non Af Amer: >60 mL/min (ref >60)
GLUCOSE: 107 mg/dL — AB (ref 65–99)
POTASSIUM: 3.7 mmol/L (ref 3.5–5.1)
Sodium: 138 mmol/L (ref 135–145)
Total Protein: 6.4 g/dL — ABNORMAL LOW (ref 6.5–8.1)

## 2014-11-17 LAB — LIPASE, BLOOD: Lipase: 29 U/L (ref 22–51)

## 2014-11-17 LAB — PREGNANCY, URINE: Preg Test, Ur: NEGATIVE

## 2014-11-17 MED ORDER — ONDANSETRON HCL 4 MG PO TABS: ORAL_TABLET | ORAL | Status: DC

## 2014-11-17 NOTE — ED Notes (Signed)
Called pts uncle and he stated he was coming to pick up pt.

## 2014-11-17 NOTE — ED Provider Notes (Signed)
[facility] Emergency Department Provider Note  ____________________________________________  Time seen: Approximately 3:12 AM  I have reviewed the triage vital signs and the nursing notes.   HISTORY  Chief Complaint Emesis and Diarrhea    HPI Taylor Fleming is a 34 y.o. female with an extensive psychiatric history of bipolar disorder with schizoaffective tendencies who was seen last week in the emergency department for SI and had some medication changes.  She reports today to the emergency department complaining of approximately one month of intermittent but persistent nausea, vomiting, and diarrhea.  The symptoms are mild to moderate with a gradual but persistent course.  She is not sure if this may be a result of her medication changes though her medication changes just occurred last week.  The last couple of days she also complains of multiple episodes of syncope where she claims that she simply "passed out ".  Each time she fell, she describes rolling gently to the ground and not sustaining any injuries.  She describes episode is brief and acute in onset.  She states that she worries about her anxiety causing some of these issues.  She denies headache, visual changes, shortness of breath, abdominal pain, dysuria.   Past Medical History  Diagnosis Date  . Depression   . Bipolar 1 disorder   . Schizophrenia     Patient Active Problem List   Diagnosis Date Noted  . Bipolar disorder with depression 11/14/2014  . Insomnia 11/14/2014    Past Surgical History  Procedure Laterality Date  . Cholecystectomy      Current Outpatient Rx  Name  Route  Sig  Dispense  Refill  . acyclovir (ZOVIRAX) 400 MG tablet   Oral   Take 2 tablets (800 mg total) by mouth 5 (five) times daily.   70 tablet   0   . augmented betamethasone dipropionate (DIPROLENE-AF) 0.05 % ointment   Topical   Apply topically 2 (two) times daily.   30 g   0   . HYDROcodone-acetaminophen  (NORCO) 5-325 MG per tablet   Oral   Take 1 tablet by mouth every 4 (four) hours as needed for moderate pain.   6 tablet   0   . methylPREDNISolone (MEDROL DOSEPAK) 4 MG TBPK tablet      Dose pack as directed   21 tablet   0   . ondansetron (ZOFRAN) 4 MG tablet      Take 1-2 tabs by mouth every 8 hours as needed for nausea/vomiting   30 tablet   0     Allergies Penicillins and Ibuprofen  History reviewed. No pertinent family history.  Social History History  Substance Use Topics  . Smoking status: Current Every Day Smoker  . Smokeless tobacco: Not on file  . Alcohol Use: Yes    Review of Systems Constitutional: No fever/chills Eyes: No visual changes. ENT: No sore throat. Cardiovascular: A brief episode of chest pain that she associates with her panic/anxiety Respiratory: Denies shortness of breath. Gastrointestinal: No abdominal pain.  Persistent nausea, vomiting, diarrhea for one month.  No constipation. Genitourinary: Negative for dysuria. Musculoskeletal: Negative for back pain. Skin: Negative for rash. Neurological: Negative for headaches, focal weakness or numbness.  10-point ROS otherwise negative.  ____________________________________________   PHYSICAL EXAM:  VITAL SIGNS: ED Triage Vitals  Enc Vitals Group     BP 11/17/14 0022 130/81 mmHg     Pulse Rate 11/17/14 0022 78     Resp 11/17/14 0022 18  Temp 11/17/14 0022 98.6 F (37 C)     Temp Source 11/17/14 0022 Oral     SpO2 11/17/14 0022 98 %     Weight 11/17/14 0016 185 lb (83.915 kg)     Height 11/17/14 0016 5\' 1"  (1.549 m)     Head Cir --      Peak Flow --      Pain Score 11/17/14 0017 8     Pain Loc --      Pain Edu? --      Excl. in GC? --     Constitutional: Alert and oriented. Well appearing and in no acute distress.  Anxious. Eyes: Conjunctivae are normal. PERRL. EOMI. Head: Atraumatic. Nose: No congestion/rhinnorhea. Mouth/Throat: Mucous membranes are moist.  Oropharynx  non-erythematous. Neck: No stridor.  No cervical spine tenderness to palpation. Cardiovascular: Normal rate, regular rhythm. Grossly normal heart sounds.  Good peripheral circulation. Respiratory: Normal respiratory effort.  No retractions. Lungs CTAB. Gastrointestinal: Soft and nontender. No distention. No abdominal bruits. No CVA tenderness. Musculoskeletal: No lower extremity tenderness nor edema.  No joint effusions. Neurologic:  Normal speech and language. No gross focal neurologic deficits are appreciated. Speech is normal. Skin:  Skin is warm, dry and intact. No rash noted. Psychiatric: Patient is anxious but alert, oriented, and appropriate. ____________________________________________   LABS (all labs ordered are listed, but only abnormal results are displayed)  Labs Reviewed  URINALYSIS COMPLETEWITH MICROSCOPIC ([facility] ONLY) - Abnormal; Notable for the following:    Color, Urine YELLOW (*)    APPearance CLEAR (*)    Ketones, ur TRACE (*)    Specific Gravity, Urine 1.034 (*)    Bacteria, UA RARE (*)    Squamous Epithelial / LPF 0-5 (*)    All other components within normal limits  COMPREHENSIVE METABOLIC PANEL - Abnormal; Notable for the following:    Glucose, Bld 107 (*)    Calcium 8.6 (*)    Total Protein 6.4 (*)    AST 14 (*)    Total Bilirubin 0.2 (*)    All other components within normal limits  CBC WITH DIFFERENTIAL/PLATELET - Abnormal; Notable for the following:    WBC 20.1 (*)    Neutro Abs 14.1 (*)    Lymphs Abs 4.3 (*)    Monocytes Absolute 1.3 (*)    All other components within normal limits  PREGNANCY, URINE  LIPASE, BLOOD   ____________________________________________  EKG  ED ECG REPORT I, Lorrin Bodner, the attending physician, personally viewed and interpreted this ECG.  Date: 11/17/2014 EKG Time: 03:12 Rate: 76 Rhythm: normal sinus rhythm QRS Axis: normal Intervals: normal ST/T Wave abnormalities: normal Conduction Disutrbances:  none Narrative Interpretation: unremarkable  ____________________________________________  RADIOLOGY  Dg Chest 2 View  11/17/2014   CLINICAL DATA:  Syncopal episodes after medication change. Vomiting and diarrhea for 1 month. History of bipolar/ schizophrenia and depression.  EXAM: CHEST  2 VIEW  COMPARISON:  None.  FINDINGS: Cardiomediastinal silhouette is unremarkable. The lungs are clear without pleural effusions or focal consolidations. Trachea projects midline and there is no pneumothorax. Soft tissue planes and included osseous structures are non-suspicious. Mild pectus excavatum. Surgical clips in the included right abdomen likely reflect cholecystectomy.  IMPRESSION: No acute cardiopulmonary process.   Electronically Signed   By: Awilda Metroourtnay  Bloomer M.D.   On: 11/17/2014 03:45    ____________________________________________   PROCEDURES  Procedure(s) performed: None  Critical Care performed: No ____________________________________________   INITIAL IMPRESSION / ASSESSMENT AND PLAN / ED COURSE  Pertinent labs & imaging results that were available during my care of the patient were reviewed by me and considered in my medical decision making (see chart for details).  The patient is well-appearing and in no acute distress.  She has no abnormal findings on physical exam and no evidence of trauma to support her description of collapsing due to syncope.  According to the [facility] syncope rule, she is at low risk for a serious outcome.  I had an extensive conversation with her about the likelihood that medication changes or causing her to feel odd and that she has a reassuring screening exam today with no evidence of an emergent medical condition.  I advised her to continue with her plans to follow up in 4 days with her therapist for further evaluation and she seemed reassured by this.  Of note, she has a leukocytosis of approximately 20, but I believe this is likely a stress reaction  and not representative of an acute infectious process.    ____________________________________________  FINAL CLINICAL IMPRESSION(S) / ED DIAGNOSES  Final diagnoses:  Syncope, unspecified syncope type  Non-intractable vomiting with nausea, vomiting of unspecified type      NEW MEDICATIONS STARTED DURING THIS VISIT:  Discharge Medication List as of 11/17/2014  4:27 AM    START taking these medications   Details  ondansetron (ZOFRAN) 4 MG tablet Take 1-2 tabs by mouth every 8 hours as needed for nausea/vomiting, Print         Loleta Rose, MD 11/17/14 [phone]

## 2014-11-17 NOTE — ED Notes (Signed)
Pt resting in stretcher, pt has had no v/d since arrival. Pt asking for pain meds. Pt in NAD.

## 2014-11-17 NOTE — ED Notes (Addendum)
Pt presents to ER via EMS stating she was seen for SI recently and had some of her meds changed. Pt reports n/v/d for one month.

## 2014-11-17 NOTE — Discharge Instructions (Signed)
As we discussed, we believe your symptoms may be the result of your recent medications changes.  Your evaluation today was reassuring. We recommend you continue taking your medications as prescribed and follow up with your doctor on Thursday as scheduled.  Return to the Emergency Department with new or worsening symptoms that concern you.   Syncope Syncope means a person passes out (faints). The person usually wakes up in less than 5 minutes. It is important to seek medical care for syncope. HOME CARE  Have someone stay with you until you feel normal.  Do not drive, use machines, or play sports until your doctor says it is okay.  Keep all doctor visits as told.  Lie down when you feel like you might pass out. Take deep breaths. Wait until you feel normal before standing up.  Drink enough fluids to keep your pee (urine) clear or pale yellow.  If you take blood pressure or heart medicine, get up slowly. Take several minutes to sit and then stand. GET HELP RIGHT AWAY IF:   You have a severe headache.  You have pain in the chest, belly (abdomen), or back.  You are bleeding from the mouth or butt (rectum).  You have black or tarry poop (stool).  You have an irregular or very fast heartbeat.  You have pain with breathing.  You keep passing out, or you have shaking (seizures) when you pass out.  You pass out when sitting or lying down.  You feel confused.  You have trouble walking.  You have severe weakness.  You have vision problems. If you fainted, call your local emergency services (911 in [place] Do not drive yourself to the hospital. MAKE SURE YOU:   Understand these instructions.  Will watch your condition.  Will get help right away if you are not doing well or get worse. Document Released: 10/26/2007 Document Revised: 11/08/2011 Document Reviewed: 07/08/2011 [facility] Patient Information 2015 [place]. This information is not intended to replace advice given  to you by your health care provider. Make sure you discuss any questions you have with your health care provider.  Nausea and Vomiting Nausea means you feel sick to your stomach. Throwing up (vomiting) is a reflex where stomach contents come out of your mouth. HOME CARE   Take medicine as told by your doctor.  Do not force yourself to eat. However, you do need to drink fluids.  If you feel like eating, eat a normal diet as told by your doctor.  Eat rice, wheat, potatoes, bread, lean meats, yogurt, fruits, and vegetables.  Avoid high-fat foods.  Drink enough fluids to keep your pee (urine) clear or pale yellow.  Ask your doctor how to replace body fluid losses (rehydrate). Signs of body fluid loss (dehydration) include:  Feeling very thirsty.  Dry lips and mouth.  Feeling dizzy.  Dark pee.  Peeing less than normal.  Feeling confused.  Fast breathing or heart rate. GET HELP RIGHT AWAY IF:   You have blood in your throw up.  You have black or bloody poop (stool).  You have a bad headache or stiff neck.  You feel confused.  You have bad belly (abdominal) pain.  You have chest pain or trouble breathing.  You do not pee at least once every 8 hours.  You have cold, clammy skin.  You keep throwing up after 24 to 48 hours.  You have a fever. MAKE SURE YOU:   Understand these instructions.  Will watch your condition.  Will get help right away if you are not doing well or get worse. Document Released: 10/26/2007 Document Revised: 08/01/2011 Document Reviewed: 10/08/2010 [facility] Patient Information 2015 [place], [place]. This information is not intended to replace advice given to you by your health care provider. Make sure you discuss any questions you have with your health care provider.

## 2014-11-17 NOTE — ED Notes (Signed)
Pt alert and in NAD at time of d/c 

## 2015-03-19 ENCOUNTER — Encounter: Payer: Self-pay | Admitting: Obstetrics and Gynecology

## 2015-04-02 ENCOUNTER — Encounter: Payer: Self-pay | Admitting: Obstetrics and Gynecology

## 2015-04-02 DIAGNOSIS — G43909 Migraine, unspecified, not intractable, without status migrainosus: Secondary | ICD-10-CM | POA: Insufficient documentation

## 2015-06-04 ENCOUNTER — Other Ambulatory Visit: Payer: Self-pay | Admitting: Family Medicine

## 2015-06-04 DIAGNOSIS — N6049 Mammary duct ectasia of unspecified breast: Secondary | ICD-10-CM

## 2015-06-04 DIAGNOSIS — N6452 Nipple discharge: Secondary | ICD-10-CM

## 2015-06-05 ENCOUNTER — Emergency Department
Admission: EM | Admit: 2015-06-05 | Discharge: 2015-06-05 | Disposition: A | Discharge status: Home / Self Care | Payer: Medicare Other | Attending: Emergency Medicine | Admitting: Emergency Medicine

## 2015-06-05 ENCOUNTER — Emergency Department: Payer: Medicare Other

## 2015-06-05 ENCOUNTER — Encounter: Payer: Self-pay | Admitting: Emergency Medicine

## 2015-06-05 DIAGNOSIS — Z880 Allergy status to penicillin: Secondary | ICD-10-CM | POA: Diagnosis not present

## 2015-06-05 DIAGNOSIS — N6452 Nipple discharge: Secondary | ICD-10-CM | POA: Diagnosis not present

## 2015-06-05 DIAGNOSIS — F17200 Nicotine dependence, unspecified, uncomplicated: Secondary | ICD-10-CM | POA: Insufficient documentation

## 2015-06-05 DIAGNOSIS — R52 Pain, unspecified: Secondary | ICD-10-CM

## 2015-06-05 DIAGNOSIS — Z3202 Encounter for pregnancy test, result negative: Secondary | ICD-10-CM | POA: Insufficient documentation

## 2015-06-05 DIAGNOSIS — N644 Mastodynia: Secondary | ICD-10-CM

## 2015-06-05 DIAGNOSIS — Z79899 Other long term (current) drug therapy: Secondary | ICD-10-CM | POA: Insufficient documentation

## 2015-06-05 LAB — CBC WITH DIFFERENTIAL/PLATELET
BASOS ABS: 0.1 10*3/uL (ref 0–0.1)
Basophils Relative: 1 %
Eosinophils Absolute: 0.2 10*3/uL (ref 0–0.7)
Eosinophils Relative: 2 %
HEMATOCRIT: 38.8 % (ref 35.0–47.0)
HEMOGLOBIN: 13.5 g/dL (ref 12.0–16.0)
LYMPHS PCT: 18 %
Lymphs Abs: 1.4 10*3/uL (ref 1.0–3.6)
MCH: 30.1 pg (ref 26.0–34.0)
MCHC: 34.7 g/dL (ref 32.0–36.0)
MCV: 86.9 fL (ref 80.0–100.0)
Monocytes Absolute: 0.7 10*3/uL (ref 0.2–0.9)
Monocytes Relative: 9 %
NEUTROS ABS: 5.6 10*3/uL (ref 1.4–6.5)
NEUTROS PCT: 70 %
Platelets: 275 10*3/uL (ref 150–440)
RBC: 4.47 MIL/uL (ref 3.80–5.20)
RDW: 13.4 % (ref 11.5–14.5)
WBC: 8 10*3/uL (ref 3.6–11.0)

## 2015-06-05 LAB — POCT PREGNANCY, URINE: PREG TEST UR: NEGATIVE

## 2015-06-05 LAB — BASIC METABOLIC PANEL
ANION GAP: 6 (ref 5–15)
BUN: 14 mg/dL (ref 6–20)
CALCIUM: 8.9 mg/dL (ref 8.9–10.3)
CO2: 23 mmol/L (ref 22–32)
Chloride: 108 mmol/L (ref 101–111)
Creatinine, Ser: 0.96 mg/dL (ref 0.44–1.00)
Glucose, Bld: 100 mg/dL — ABNORMAL HIGH (ref 65–99)
Potassium: 4.2 mmol/L (ref 3.5–5.1)
Sodium: 137 mmol/L (ref 135–145)

## 2015-06-05 MED ORDER — HYDROCODONE-ACETAMINOPHEN 5-325 MG PO TABS: 1.0000 | ORAL_TABLET | Freq: Once | ORAL | Status: DC

## 2015-06-05 MED ORDER — HYDROCODONE-ACETAMINOPHEN 5-325 MG PO TABS
1.0000 | ORAL_TABLET | Freq: Once | ORAL | Status: AC
  Administered 2015-06-05: 1 via ORAL

## 2015-06-05 MED ORDER — HYDROCODONE-ACETAMINOPHEN 5-325 MG PO TABS
ORAL_TABLET | ORAL | Status: AC
Start: 2015-06-05 — End: 2015-06-05
  Filled 2015-06-05: qty 1

## 2015-06-05 MED ORDER — CEPHALEXIN 500 MG PO CAPS
500.0000 mg | ORAL_CAPSULE | Freq: Four times a day (QID) | ORAL | Status: DC
Start: 2015-06-05 — End: 2015-10-13

## 2015-06-05 NOTE — ED Notes (Signed)
Pt resting in bed quietly in no acute distress, returned from [place]

## 2015-06-05 NOTE — ED Notes (Signed)
Breast drainage "army green drainage" sent by PCP , being treated with PO ABX with no improvement, deformed left breast nipple x 2 weeks

## 2015-06-05 NOTE — ED Provider Notes (Signed)
[facility] Emergency Department Provider Note  ____________________________________________  Time seen: 1600  I have reviewed the triage vital signs and the nursing notes.  History by:    HISTORY  Chief Complaint Breast Discharge     HPI Taylor Fleming is a 35 y.o. female who has been sent to the emergency department by her nurse practitioner at [facility] due to concern for bilateral breast discharge.  The patient reports she has had discomfort for nearly 2 weeks. She was seen by the provider at [facility] yesterday and started on Bactrim. Apparently there was concern for methicillin-resistant staph aureus. She was seen again today. Yesterday she had discharge from just the left breast. Today she has additional discharge from the right breast as well. She also reports she has pain in the left breast and some discomfort into her left arm.  The patient tells me that because she had not improved with Bactrim, that the symptoms had spread to the other breast, the provider advised her to come the emergency department for a possible ultrasound and possible further antibiotics.  She reports having had similar symptoms approximately one year ago. She reports that she is on psychiatric medications that trigger some degree of lactation and with nowhere for that and felt to go, she is prone to infections. When this occurred 1 year ago, he cleared up with oral antibiotics alone. At that time, she reported there was some blood in the ductal discharge. Currently the discharge is "Army green" in a very small amount, just enough to show up on a piece of tissue. This has been cultured at [facility].    Past Medical History  Diagnosis Date  . Depression   . Bipolar 1 disorder (HCC)   . Schizophrenia [facility])     Patient Active Problem List   Diagnosis Date Noted  . Bipolar disorder with depression (HCC) 11/14/2014  . Insomnia  11/14/2014    Past Surgical History  Procedure Laterality Date  . Cholecystectomy    . Cesarean section      Current Outpatient Rx  Name  Route  Sig  Dispense  Refill  . acyclovir (ZOVIRAX) 400 MG tablet   Oral   Take 2 tablets (800 mg total) by mouth 5 (five) times daily.   70 tablet   0   . augmented betamethasone dipropionate (DIPROLENE-AF) 0.05 % ointment   Topical   Apply topically 2 (two) times daily.   30 g   0   . cephALEXin (KEFLEX) 500 MG capsule   Oral   Take 1 capsule (500 mg total) by mouth 4 (four) times daily.   28 capsule   0   . HYDROcodone-acetaminophen (NORCO) 5-325 MG per tablet   Oral   Take 1 tablet by mouth every 4 (four) hours as needed for moderate pain.   6 tablet   0   . HYDROcodone-acetaminophen (NORCO/VICODIN) 5-325 MG tablet   Oral   Take 1 tablet by mouth once.   12 tablet   0   . methylPREDNISolone (MEDROL DOSEPAK) 4 MG TBPK tablet      Dose pack as directed   21 tablet   0   . ondansetron (ZOFRAN) 4 MG tablet      Take 1-2 tabs by mouth every 8 hours as needed for nausea/vomiting   30 tablet   0     Allergies Penicillins and Ibuprofen  No family history on file.  Social History Social History  Substance  Use Topics  . Smoking status: Current Every Day Smoker  . Smokeless tobacco: None  . Alcohol Use: Yes    Review of Systems  Constitutional: Negative for fever/chills. ENT: Negative for congestion. Cardiovascular: Negative for chest pain. Breast: Breast tenderness, particularly on the left. Bilateral discharge, see history of present illness. Respiratory: Negative for cough. Gastrointestinal: Negative for abdominal pain, vomiting and diarrhea. Genitourinary: Negative for dysuria. Musculoskeletal: No back pain. Skin: Negative for rash. Neurological: Negative for headache or focal weakness   10-point ROS otherwise negative.  ____________________________________________   PHYSICAL EXAM:  VITAL  SIGNS: ED Triage Vitals  Enc Vitals Group     BP 06/05/15 1243 126/73 mmHg     Pulse Rate 06/05/15 1243 92     Resp 06/05/15 1243 18     Temp 06/05/15 1243 98.7 F (37.1 C)     Temp Source 06/05/15 1243 Oral     SpO2 06/05/15 1243 100 %     Weight 06/05/15 1243 198 lb (89.812 kg)     Height 06/05/15 1243 5\' 1"  (1.549 m)     Head Cir --      Peak Flow --      Pain Score 06/05/15 1244 9     Pain Loc --      Pain Edu? --      Excl. in GC? --     Constitutional: Alert and oriented. Well appearing and in no distress. ENT   Head: Normocephalic and atraumatic.   Nose: No congestion/rhinnorhea.  Cardiovascular: Normal rate, regular rhythm, no murmur noted Breasts: Overall normal appearing overall symmetrical breasts. There is mild irregularity of the left nipple, but there is no firmness or abnormally nodular quality to the area lot or the remaining breast. She does report some tenderness in the superior portion of the breast, especially in the medial section over the pectoralis muscle. There is no erythema in either breast. On pressure, there is no discharge from the nipples noted. There is no appearance of abscess. Chest wall: Patient does have some tenderness in the medial portion of her left chest. There is no tenderness over the right pectoralis muscle. She has no pain with flexion, abduction, of the left arm. Respiratory:  Normal respiratory effort, no tachypnea.    Breath sounds are clear and equal bilaterally.  Gastrointestinal: Soft, no distention. Nontender Musculoskeletal: No deformity noted. Nontender with normal range of motion in all extremities.  No noted edema. Neurologic:  Communicative. Normal appearing spontaneous movement in all 4 extremities. No gross focal neurologic deficits are appreciated.  Skin:  Skin is warm, dry. No rash noted. Psychiatric: Mood and affect are normal. Speech and behavior are normal.  ____________________________________________    ____________________________________________    RADIOLOGY  Ultrasound, bilateral breasts:  FINDINGS: RIGHT BREAST: No mass, distortion, or acoustic shadowing is demonstrated with ultrasound.  LEFT BREAST: No mass, distortion, or acoustic shadowing is demonstrated with ultrasound.There is mild thickening of the left nipple, associated with increased vascularity by Doppler exam. IMPRESSION: 1. No evidence for malignancy. 2. Suspect inflammation or infection of the left nipple-areolar complex.  RECOMMENDATION: Follow-up bilateral mammography and possible bilateral US is recommended at a dedicated breast imaging facility in 6 weeks. ____________________________________________   INITIAL IMPRESSION / ASSESSMENT AND PLAN / ED COURSE  Pertinent labs & imaging results that were available during my care of the patient were reviewed by me and considered in my medical decision making (see chart for details).  Well-appearing 35 year old female with a report of  breast tenderness and a small amount of green discharge. She has been on Bactrim for over 24 hours. She has no erythema or sign of abscess. She may have a deeper pocket of infection, though I have low suspicion for this. We will obtain an ultrasound for further evaluation. As the patient and her outpatient provider are particularly concerned about the potential for a more contacted infection, I have discussed broadening the antibiotic coverage with the patient. She has a reported allergy to penicillin, which she reports is having itchiness and will tightness in her throat. She has no other signs and symptoms of an allergic reaction or anaphylaxis with penicillin. I have discussed the use of Keflex with her and the low chance of a reaction. She agrees to use the Keflex and understands signs and symptoms to look out for and to return to the emergency department for.  ----------------------------------------- 4:55 PM on  06/05/2015 -----------------------------------------  I have discussed the case with radiology after having placed the order for ultrasound. They had reservations about this that he initially, but now reports that the clinical question being asked is reasonable and the study will be performed.  ----------------------------------------- 7:31 PM on 06/05/2015 -----------------------------------------  Ultrasound of the breast does not show any abscess. There is no obvious evidence for malignancy. This study is limited for malignancy and the patient will need to undergo mammogram as an outpatient if there are any lingering questions. He is suspected inflammation in the left nipple area area. We will add Keflex to the patient's antibiotic regimen as we have discussed with her.   ____________________________________________   FINAL CLINICAL IMPRESSION(S) / ED DIAGNOSES  Final diagnoses:  Pain  Discharge from both nipples  Pain of left breast       Darien Ramusavid W Lisa Milian, MD 06/05/15 [phone]

## 2015-06-05 NOTE — ED Notes (Signed)
Pt discharged to home.  Friend driving.  Discharge instructions reviewed.  Verbalized understanding.  No questions or concerns at this time.  Teach back verified.  Pt in NAD.  No items left in ED.   

## 2015-06-05 NOTE — Discharge Instructions (Signed)
No abscess was noted on ultrasound. Ultrasound did see some inflammation near the left nipple. Continue the Bactrim. You may add Keflex. Take Vicodin if needed for discomfort. [facility]. Return to the emergency department if you have worsening symptoms, fever, or other urgent concerns.  Breast Tenderness Breast tenderness is a common problem for women of all ages. Breast tenderness may cause mild discomfort to severe pain. It has a variety of causes. Your health care provider will find out the likely cause of your breast tenderness by examining your breasts, asking you about symptoms, and ordering some tests. Breast tenderness usually does not mean you have breast cancer. HOME CARE INSTRUCTIONS  Breast tenderness often can be handled at home. You can try:  Getting fitted for a new bra that provides more support, especially during exercise.  Wearing a more supportive bra or sports bra while sleeping when your breasts are very tender.  If you have a breast injury, apply ice to the area:  Put ice in a plastic bag.  Place a towel between your skin and the bag.  Leave the ice on for 20 minutes, 2-3 times a day.  If your breasts are too full of milk as a result of breastfeeding, try:  Expressing milk either by hand or with a breast pump.  Applying a warm compress to the breasts for relief.  Taking over-the-counter pain relievers, if approved by your health care provider.  Taking other medicines that your health care provider prescribes. These may include antibiotic medicines or birth control pills. Over the long term, your breast tenderness might be eased if you:  Cut down on caffeine.  Reduce the amount of fat in your diet. Keep a log of the days and times when your breasts are most tender. This will help you and your health care provider find the cause of the tenderness and how to relieve it. Also, learn how to do breast exams at home.  This will help you notice if you have an unusual growth or lump that could cause tenderness. SEEK MEDICAL CARE IF:   Any part of your breast is hard, red, and hot to the touch. This could be a sign of infection.  Fluid is coming out of your nipples (and you are not breastfeeding). Especially watch for blood or pus.  You have a fever as well as breast tenderness.  You have a new or painful lump in your breast that remains after your menstrual period ends.  You have tried to take care of the pain at home, but it has not gone away.  Your breast pain is getting worse, or the pain is making it hard to do the things you usually do during your day.   This information is not intended to replace advice given to you by your health care provider. Make sure you discuss any questions you have with your health care provider.   Document Released: 04/21/2008 Document Revised: 01/09/2013 Document Reviewed: 12/06/2012 Elsevier Interactive Patient Education [employer]2016 Elsevier Inc.

## 2015-06-05 NOTE — ED Notes (Signed)
Pt resting in bed, playing on phone in no acute distress

## 2015-06-05 NOTE — ED Notes (Signed)
Pt states left breast discharge and pain, states she was seen by her PCP and they cultured and placed her on ABX, pt now states her right breast is having army green discharge and cracking and she is not responding to abx, no discharge noted at time of assessment

## 2015-10-13 ENCOUNTER — Encounter: Payer: Self-pay | Admitting: Psychiatry

## 2015-10-13 ENCOUNTER — Ambulatory Visit (INDEPENDENT_AMBULATORY_CARE_PROVIDER_SITE_OTHER): Payer: Medicare Other | Admitting: Psychiatry

## 2015-10-13 VITALS — BP 132/88 | HR 81 | Temp 97.7°F | Ht 61.0 in | Wt 200.4 lb

## 2015-10-13 DIAGNOSIS — F411 Generalized anxiety disorder: Secondary | ICD-10-CM | POA: Diagnosis not present

## 2015-10-13 DIAGNOSIS — F250 Schizoaffective disorder, bipolar type: Secondary | ICD-10-CM | POA: Diagnosis not present

## 2015-10-13 MED ORDER — HALOPERIDOL 2 MG PO TABS: 2.0000 mg | ORAL_TABLET | Freq: Two times a day (BID) | ORAL | Status: DC

## 2015-10-13 MED ORDER — DIVALPROEX SODIUM ER 250 MG PO TB24: 250.0000 mg | ORAL_TABLET | Freq: Every day | ORAL | Status: DC

## 2015-10-13 MED ORDER — BUPROPION HCL 75 MG PO TABS: 75.0000 mg | ORAL_TABLET | Freq: Every day | ORAL | Status: DC

## 2015-10-13 MED ORDER — TRAZODONE HCL 100 MG PO TABS: 100.0000 mg | ORAL_TABLET | Freq: Every day | ORAL | Status: DC
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
## 2015-10-30 ENCOUNTER — Telehealth: Payer: Self-pay

## 2015-11-02 ENCOUNTER — Encounter: Payer: Self-pay | Admitting: Psychiatry

## 2015-11-02 ENCOUNTER — Ambulatory Visit (INDEPENDENT_AMBULATORY_CARE_PROVIDER_SITE_OTHER): Payer: 59 | Admitting: Psychiatry

## 2015-11-02 VITALS — BP 104/70 | HR 79 | Ht 61.0 in | Wt 195.4 lb

## 2015-11-02 DIAGNOSIS — F411 Generalized anxiety disorder: Secondary | ICD-10-CM

## 2015-11-02 DIAGNOSIS — F250 Schizoaffective disorder, bipolar type: Secondary | ICD-10-CM | POA: Diagnosis not present

## 2015-11-02 MED ORDER — BUPROPION HCL 75 MG PO TABS: 75.0000 mg | ORAL_TABLET | Freq: Every day | ORAL | Status: DC

## 2015-11-02 MED ORDER — HALOPERIDOL 2 MG PO TABS: 2.0000 mg | ORAL_TABLET | Freq: Two times a day (BID) | ORAL | Status: DC

## 2015-11-02 MED ORDER — DIVALPROEX SODIUM ER 250 MG PO TB24: 250.0000 mg | ORAL_TABLET | Freq: Every day | ORAL | Status: DC

## 2015-11-02 MED ORDER — BUSPIRONE HCL 10 MG PO TABS: 10.0000 mg | ORAL_TABLET | Freq: Every day | ORAL | Status: DC

## 2015-11-02 NOTE — Telephone Encounter (Signed)
Advise her to make appointment to discuss meds.

## 2015-11-02 NOTE — Progress Notes (Signed)
Psychiatric MD Progress Note   Patient Identification: Taylor Fleming MRN:  [id number] Date of Evaluation:  11/02/2015 Referral Source: [facility]  Chief Complaint:    Visit Diagnosis:    ICD-9-CM ICD-10-CM   1. Schizoaffective disorder, bipolar type (HCC) 295.70 F25.0   2. GAD (generalized anxiety disorder) 300.02 F41.1     History of Present Illness:    Patient is a 35 year old female who was transferred from [facility] presented  for follow-up. She reported that she is feeling very tired because of all the new medications adjusted started at the previous appointment. She appeared tired.  She reported that the auditory hallucinations and the mood symptoms have started improving. She does not have mood swings at this time.  She stated that she has been sleeping for the day. She is also experiencing diarrhea related to the Depakote. She has lost 9 pounds since her last appointment. She currently denied having any suicidal ideations or plans. She reported that the medications are helping her and she wants to have her medications adjusted at this time.    Associated Signs/Symptoms: Depression Symptoms:  depressed mood, insomnia, fatigue, hopelessness, impaired memory, anxiety, loss of energy/fatigue, disturbed sleep, weight gain, (Hypo) Manic Symptoms:  Distractibility, Flight of Ideas, Impulsivity, Irritable Mood, Labiality of Mood, Anxiety Symptoms:  Excessive Worry, Psychotic Symptoms:  Hallucinations: Auditory floating PTSD Symptoms: Negative NA  Past Psychiatric History:  She described her by psychiatric history as follows Age 66- 6 - 7 times, suicidal attemts, cutting wrists, OD, running in front of vehicle Age17 -  Suicide attempt  Age 54- suicidal thoughts Age 3- 58- twice just going to ER Does not know the names of hospitals. Lived in [place], [place], [place] and here Was in [facility] and [facility]   Previous Psychotropic  Medications: Latuda Lithium- h/o Li  shock Seroquel lamictal- shingles   Substance Abuse History in the last 12 months:  No.  Consequences of Substance Abuse: Negative NA  Past Medical History:  Past Medical History  Diagnosis Date  . Depression   . Bipolar 1 disorder (HCC)   . Schizophrenia [facility])     Past Surgical History  Procedure Laterality Date  . Cholecystectomy    . Cesarean section      Family Psychiatric History:  Kateri Mc is schizophrenic and aunt bipolar   Family History:  Family History  Problem Relation Age of Onset  . Alcohol abuse Mother   . Drug abuse Mother   . Drug abuse Father   . Alcohol abuse Father   . Alcohol abuse Sister   . Drug abuse Sister     Social History:   Social History   Social History  . Marital Status: Legally Separated    Spouse Name: N/A  . Number of Children: N/A  . Years of Education: N/A   Social History Main Topics  . Smoking status: Current Every Day Smoker -- 1.00 packs/day    Types: Cigarettes    Start date: 10/12/1992  . Smokeless tobacco: Never Used  . Alcohol Use: No  . Drug Use: No  . Sexual Activity: Not Currently   Other Topics Concern  . None   Social History Narrative    Additional Social History:   Lives  With aunt, uncle, son  and cousin Married x 2  Legally married- seperated now.   Allergies:   Allergies  Allergen Reactions  . Penicillin G Swelling    Blisters in throat  . Penicillins Swelling  . Ibuprofen Rash  Metabolic Disorder Labs: No results found for: HGBA1C, MPG No results found for: PROLACTIN No results found for: CHOL, TRIG, HDL, CHOLHDL, VLDL, LDLCALC   Current Medications: Current Outpatient Prescriptions  Medication Sig Dispense Refill  . buPROPion (WELLBUTRIN) 75 MG tablet Take 1 tablet (75 mg total) by mouth daily at 6 (six) AM. 30 tablet 0  . busPIRone (BUSPAR) 10 MG tablet Take by mouth. 1 pill at night- pt has supply    . divalproex (DEPAKOTE ER) 250 MG  24 hr tablet Take 1 tablet (250 mg total) by mouth daily. 30 tablet 0  . haloperidol (HALDOL) 2 MG tablet Take 1 tablet (2 mg total) by mouth 2 (two) times daily. 60 tablet 1  . linaclotide (LINZESS) 145 MCG CAPS capsule Take by mouth.    . traZODone (DESYREL) 100 MG tablet Take 1 tablet (100 mg total) by mouth at bedtime. 30 tablet 0   No current facility-administered medications for this visit.    Neurologic: Headache: No Seizure: No Paresthesias:No  Musculoskeletal: Strength & Muscle Tone: within normal limits Gait & Station: normal Patient leans: N/A  Psychiatric Specialty Exam: ROS   Blood pressure 104/70, pulse 79, height 5\' 1"  (1.549 m), weight 195 lb 6.4 oz (88.633 kg), last menstrual period 11/02/2015, SpO2 96 %.Body mass index is 36.94 kg/(m^2).  General Appearance: Casual  Eye Contact:  Fair  Speech:  Clear and Coherent  Volume:  Normal  Mood:  Anxious  Affect:  Congruent  Thought Process:  Disorganized  Orientation:  Full (Time, Place, and Person)  Thought Content:  Hallucinations: Auditory  Suicidal Thoughts:  No  Homicidal Thoughts:  No  Memory:  Immediate;   Fair  Judgement:  Impaired  Insight:  Shallow  Psychomotor Activity:  Normal  Concentration:  Concentration: Fair and Attention Span: Fair  Recall:  [employer]Fair  Fund of Knowledge:Fair  Language: Fair  Akathisia:  No  Handed:  Right  AIMS (if indicated):    Assets:  Communication Skills Desire for Improvement Physical Health Social Support Transportation  ADL's:  Intact  Cognition: WNL  Sleep:      Treatment Plan Summary: Medication management   Discussed with patient about her medications treatment risks benefits and alternatives Continue Wellbutrin 75 mg in the morning Continue Depakote 250 mg daily for mood stabilization Start Haldol 1 mg by mouth at bedtime DC Trazodone  BuSpar 10 mg by mouth daily at bedtime She agreed with the medication changes and will follow-up in one month or  earlier depending on her symptoms    More than 50% of the time spent in psychoeducation, counseling and coordination of care.    This note was generated in part or whole with voice recognition software. Voice regonition is usually quite accurate but there are transcription errors that can and very often do occur. I apologize for any typographical errors that were not detected and corrected.    Brandy HaleUzma Claudette Wermuth, MD 6/12/20173:31 PM

## 2015-11-12 ENCOUNTER — Ambulatory Visit: Payer: Medicare Other | Admitting: Psychiatry

## 2015-11-13 ENCOUNTER — Telehealth: Payer: Self-pay

## 2015-11-13 NOTE — Telephone Encounter (Signed)
pt states that last visit doctor took her off her trazodone and she has not been able to sleep.  pt states that especially the last few days she has not bee able to sleep at all.  pt was advised that there were no doctor's in the office today or Monday that she will either need to get a over the counter sleep aid or go to the er.  No physician until Tuesday.

## 2015-11-17 MED ORDER — TRAZODONE HCL 50 MG PO TABS: 50.0000 mg | ORAL_TABLET | Freq: Every day | ORAL | Status: DC

## 2015-11-17 NOTE — Telephone Encounter (Signed)
Med ordered

## 2015-12-02 ENCOUNTER — Ambulatory Visit (INDEPENDENT_AMBULATORY_CARE_PROVIDER_SITE_OTHER): Payer: 59 | Admitting: Psychiatry

## 2015-12-02 DIAGNOSIS — F250 Schizoaffective disorder, bipolar type: Secondary | ICD-10-CM | POA: Diagnosis not present

## 2015-12-02 DIAGNOSIS — F411 Generalized anxiety disorder: Secondary | ICD-10-CM

## 2015-12-02 MED ORDER — DIVALPROEX SODIUM ER 500 MG PO TB24: 500.0000 mg | ORAL_TABLET | Freq: Every day | ORAL | Status: DC

## 2015-12-02 MED ORDER — TRAZODONE HCL 100 MG PO TABS: 100.0000 mg | ORAL_TABLET | Freq: Every day | ORAL | Status: DC

## 2015-12-02 MED ORDER — HALOPERIDOL 2 MG PO TABS: 2.0000 mg | ORAL_TABLET | Freq: Every day | ORAL | Status: DC

## 2015-12-02 MED ORDER — BUPROPION HCL 75 MG PO TABS: 75.0000 mg | ORAL_TABLET | Freq: Every day | ORAL | Status: DC
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
## 2015-12-14 IMAGING — CR DG CHEST 2V
2 series · 2 of 2 positions shown · non-contrast
Comparison: None.

CLINICAL DATA: Syncopal episodes after medication change. Vomiting
and diarrhea for 1 month. History of bipolar/ schizophrenia and
depression.

EXAM:
CHEST  2 VIEW

[chest pa]
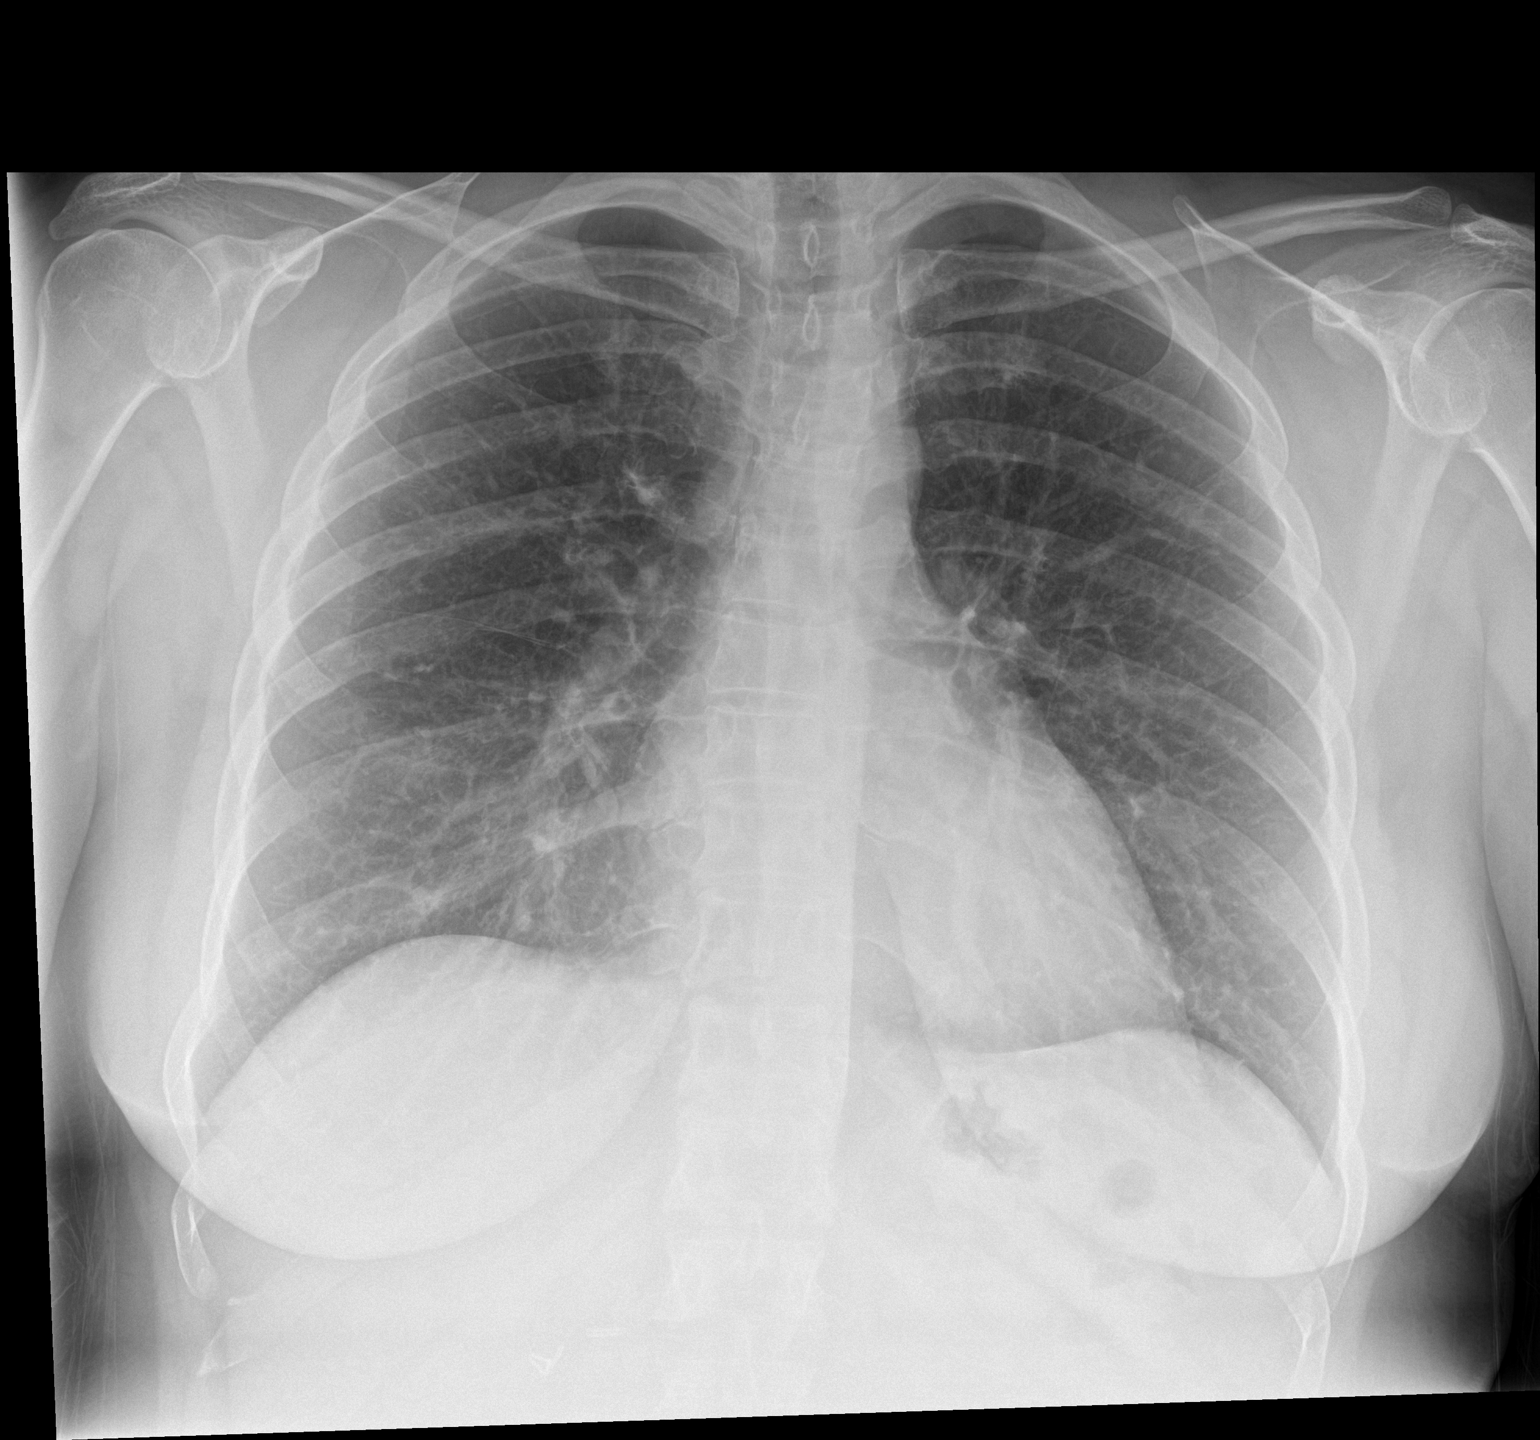

[chest lat]
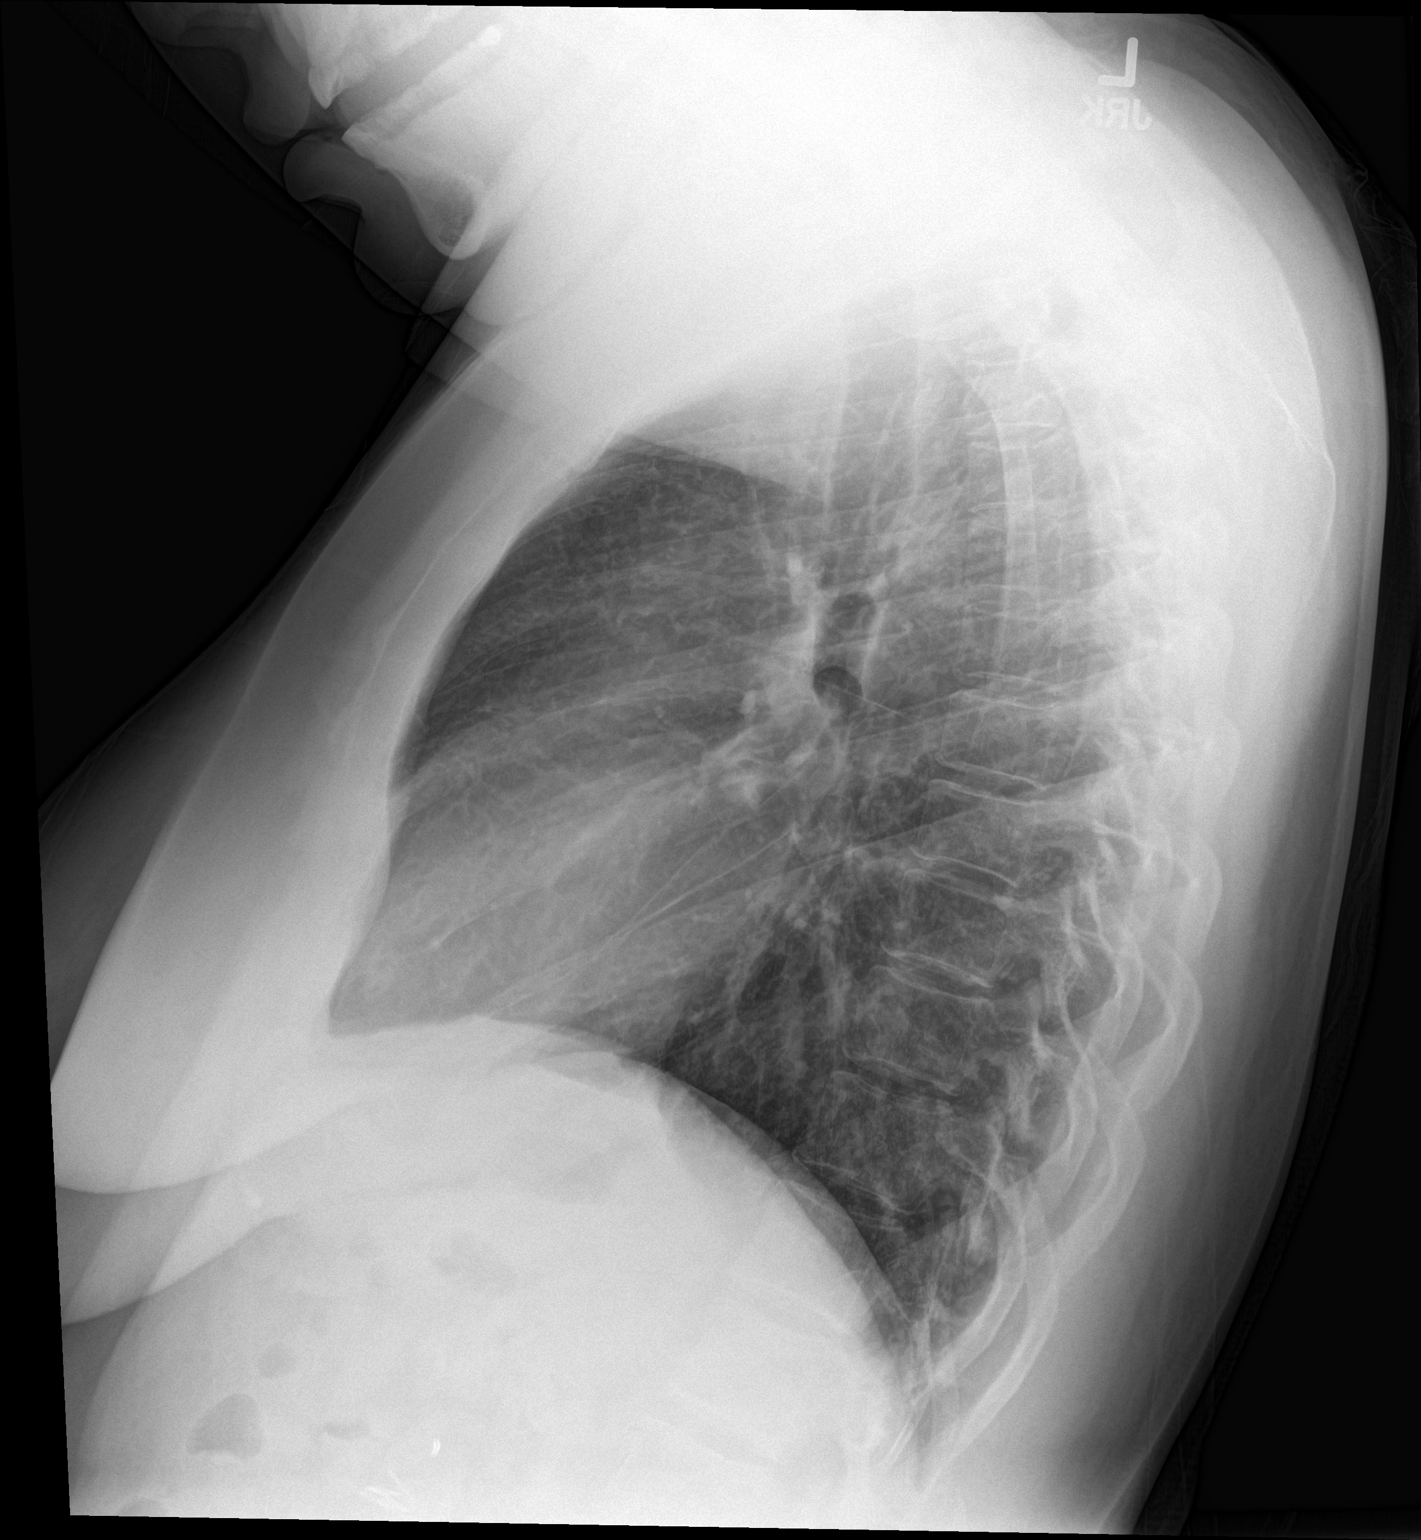

[2 of 2 positions shown; findings below may reference images not displayed]

FINDINGS: Cardiomediastinal silhouette is unremarkable. The lungs are clear
without pleural effusions or focal consolidations. Trachea projects
midline and there is no pneumothorax. Soft tissue planes and
included osseous structures are non-suspicious. Mild pectus
excavatum. Surgical clips in the included right abdomen likely
reflect cholecystectomy.
IMPRESSION: No acute cardiopulmonary process.

## 2015-12-15 ENCOUNTER — Inpatient Hospital Stay
Admission: EM | Admit: 2015-12-15 | Discharge: 2015-12-18 | DRG: 885 | Disposition: A | Discharge status: Home / Self Care | Payer: Medicare Other | Source: Intra-hospital | Attending: Psychiatry | Admitting: Psychiatry

## 2015-12-15 ENCOUNTER — Encounter: Payer: Self-pay | Admitting: Psychiatry

## 2015-12-15 ENCOUNTER — Ambulatory Visit (INDEPENDENT_AMBULATORY_CARE_PROVIDER_SITE_OTHER): Payer: 59 | Admitting: Psychiatry

## 2015-12-15 VITALS — BP 120/76 | HR 90 | Temp 98.4°F | Wt 194.0 lb

## 2015-12-15 DIAGNOSIS — F250 Schizoaffective disorder, bipolar type: Secondary | ICD-10-CM

## 2015-12-15 DIAGNOSIS — F17210 Nicotine dependence, cigarettes, uncomplicated: Secondary | ICD-10-CM | POA: Diagnosis present

## 2015-12-15 DIAGNOSIS — F319 Bipolar disorder, unspecified: Secondary | ICD-10-CM | POA: Diagnosis present

## 2015-12-15 DIAGNOSIS — G4710 Hypersomnia, unspecified: Secondary | ICD-10-CM | POA: Diagnosis present

## 2015-12-15 DIAGNOSIS — F411 Generalized anxiety disorder: Secondary | ICD-10-CM

## 2015-12-15 DIAGNOSIS — Z9049 Acquired absence of other specified parts of digestive tract: Secondary | ICD-10-CM | POA: Diagnosis not present

## 2015-12-15 DIAGNOSIS — Z79899 Other long term (current) drug therapy: Secondary | ICD-10-CM | POA: Diagnosis not present

## 2015-12-15 DIAGNOSIS — G4700 Insomnia, unspecified: Secondary | ICD-10-CM | POA: Diagnosis present

## 2015-12-15 DIAGNOSIS — E221 Hyperprolactinemia: Secondary | ICD-10-CM | POA: Diagnosis present

## 2015-12-15 DIAGNOSIS — Z818 Family history of other mental and behavioral disorders: Secondary | ICD-10-CM | POA: Diagnosis not present

## 2015-12-15 DIAGNOSIS — Z811 Family history of alcohol abuse and dependence: Secondary | ICD-10-CM

## 2015-12-15 DIAGNOSIS — F603 Borderline personality disorder: Secondary | ICD-10-CM | POA: Diagnosis present

## 2015-12-15 DIAGNOSIS — F17200 Nicotine dependence, unspecified, uncomplicated: Secondary | ICD-10-CM

## 2015-12-15 DIAGNOSIS — F314 Bipolar disorder, current episode depressed, severe, without psychotic features: Secondary | ICD-10-CM | POA: Diagnosis not present

## 2015-12-15 DIAGNOSIS — Z23 Encounter for immunization: Secondary | ICD-10-CM

## 2015-12-15 DIAGNOSIS — R45851 Suicidal ideations: Secondary | ICD-10-CM | POA: Diagnosis present

## 2015-12-15 DIAGNOSIS — Z880 Allergy status to penicillin: Secondary | ICD-10-CM

## 2015-12-15 DIAGNOSIS — K5909 Other constipation: Secondary | ICD-10-CM | POA: Diagnosis present

## 2015-12-15 DIAGNOSIS — G43909 Migraine, unspecified, not intractable, without status migrainosus: Secondary | ICD-10-CM | POA: Diagnosis present

## 2015-12-15 MED ORDER — ALUM & MAG HYDROXIDE-SIMETH 200-200-20 MG/5ML PO SUSP: 30.0000 mL | ORAL | Status: DC | PRN

## 2015-12-15 MED ORDER — BUPROPION HCL 75 MG PO TABS
75.0000 mg | ORAL_TABLET | Freq: Every day | ORAL | Status: DC
  Administered 2015-12-16 – 2015-12-18 (×3): 75 mg via ORAL
  Filled 2015-12-15 (×3): qty 1

## 2015-12-15 MED ORDER — HALOPERIDOL 0.5 MG PO TABS
2.0000 mg | ORAL_TABLET | Freq: Every day | ORAL | Status: DC
  Administered 2015-12-16: 2 mg via ORAL
  Filled 2015-12-15: qty 4

## 2015-12-15 MED ORDER — DIVALPROEX SODIUM ER 500 MG PO TB24
500.0000 mg | ORAL_TABLET | Freq: Every day | ORAL | Status: DC
  Administered 2015-12-15: 500 mg via ORAL
  Filled 2015-12-15 (×2): qty 1

## 2015-12-15 MED ORDER — TRAZODONE HCL 100 MG PO TABS
100.0000 mg | ORAL_TABLET | Freq: Every day | ORAL | Status: DC
  Administered 2015-12-16 – 2015-12-17 (×3): 100 mg via ORAL
  Filled 2015-12-15 (×3): qty 1

## 2015-12-15 MED ORDER — LINACLOTIDE 145 MCG PO CAPS
145.0000 ug | ORAL_CAPSULE | Freq: Every day | ORAL | Status: DC
  Filled 2015-12-15 (×3): qty 1

## 2015-12-15 MED ORDER — ACETAMINOPHEN 325 MG PO TABS
650.0000 mg | ORAL_TABLET | Freq: Four times a day (QID) | ORAL | Status: DC | PRN
  Administered 2015-12-17: 650 mg via ORAL
  Filled 2015-12-15: qty 2

## 2015-12-15 MED ORDER — MAGNESIUM HYDROXIDE 400 MG/5ML PO SUSP: 30.0000 mL | Freq: Every day | ORAL | Status: DC | PRN

## 2015-12-15 NOTE — Progress Notes (Signed)
Psychiatric MD Progress Note   Patient Identification: Taylor Fleming MRN:  [id number] Date of Evaluation:  12/15/2015 Referral Source: [facility]  Chief Complaint:   Chief Complaint    Depression; Fatigue     Visit Diagnosis:    ICD-9-CM ICD-10-CM   1. Schizoaffective disorder, bipolar type (HCC) 295.70 F25.0   2. GAD (generalized anxiety disorder) 300.02 F41.1     History of Present Illness:    Patient is a 35 year old female who was transferred from [facility] presented  for follow-up. She was accompanied by her husband. She reported that she has been feeling very depressed and has been having negative thoughts including thoughts to hurt herself. She reported that she is getting worse. Patient reported that she is unable to control herself and she wakes up every morning with the thought to hurt herself. She is having relationship issues with her family members and she is unable to contract for safety at this time.  Her husband was also present during the interview reported that he is concerned about her behavior as well. Patient reported that she is compliant with the medications but they are not helping her. Her 61 year old son just returned from [place] and she was concerned about him as he had a good time with the grandfather. The family is planning to go to [place] next weekend.  She has good relationship with her sister-in-law and her husband and was complaining about them. She was tearful throughout the interview and her husband was very supportive. We discussed about the option of inpatient admission and her husband also agreed with the plan.  She is unable to control her anxiety depression and suicidal ideations.   Associated Signs/Symptoms: Depression Symptoms:  depressed mood, insomnia, fatigue, hopelessness, impaired memory, anxiety, loss of energy/fatigue, disturbed sleep, weight gain, (Hypo) Manic Symptoms:  Distractibility, Flight  of Ideas, Impulsivity, Irritable Mood, Labiality of Mood, Anxiety Symptoms:  Excessive Worry, Psychotic Symptoms:  Hallucinations: Auditory floating PTSD Symptoms: Negative NA  Past Psychiatric History:  She described her by psychiatric history as follows Age 15- 5 - 7 times, suicidal attemts, cutting wrists, OD, running in front of vehicle Age17 -  Suicide attempt  Age 89- suicidal thoughts Age 44- 3- twice just going to ER Does not know the names of hospitals. Lived in [place], [place], [place] and here Was in [facility] and [facility]   Previous Psychotropic Medications: Latuda Lithium- h/o Li  shock Seroquel lamictal- shingles   Substance Abuse History in the last 12 months:  No.  Consequences of Substance Abuse: Negative NA  Past Medical History:  Past Medical History:  Diagnosis Date  . Bipolar 1 disorder (HCC)   . Depression   . Schizophrenia [facility])     Past Surgical History:  Procedure Laterality Date  . CESAREAN SECTION    . CHOLECYSTECTOMY      Family Psychiatric History:  Kateri Mc is schizophrenic and aunt bipolar   Family History:  Family History  Problem Relation Age of Onset  . Alcohol abuse Mother   . Drug abuse Mother   . Drug abuse Father   . Alcohol abuse Father   . Alcohol abuse Sister   . Drug abuse Sister     Social History:   Social History   Social History  . Marital status: Legally Separated    Spouse name: N/A  . Number of children: N/A  . Years of education: N/A   Social History Main Topics  . Smoking status: Current Every Day Smoker  Packs/day: 1.00    Types: Cigarettes    Start date: 10/12/1992  . Smokeless tobacco: Never Used  . Alcohol use No  . Drug use: No  . Sexual activity: Not Currently   Other Topics Concern  . None   Social History Narrative  . None    Additional Social History:   Lives  With aunt, uncle, son  and cousin Married x 2  Legally married- seperated now.   Allergies:   Allergies  Allergen Reactions  .  Penicillin G Swelling    Blisters in throat  . Penicillins Swelling  . Ibuprofen Rash    Metabolic Disorder Labs: No results found for: HGBA1C, MPG No results found for: PROLACTIN No results found for: CHOL, TRIG, HDL, CHOLHDL, VLDL, LDLCALC   Current Medications: Current Outpatient Prescriptions  Medication Sig Dispense Refill  . buPROPion (WELLBUTRIN) 75 MG tablet Take 1 tablet (75 mg total) by mouth daily after breakfast. 30 tablet 0  . divalproex (DEPAKOTE ER) 500 MG 24 hr tablet Take 1 tablet (500 mg total) by mouth daily. 30 tablet 0  . haloperidol (HALDOL) 2 MG tablet Take 1 tablet (2 mg total) by mouth at bedtime. 30 tablet 1  . linaclotide (LINZESS) 145 MCG CAPS capsule Take by mouth.    . traZODone (DESYREL) 100 MG tablet Take 1 tablet (100 mg total) by mouth at bedtime. 30 tablet 0   No current facility-administered medications for this visit.     Neurologic: Headache: No Seizure: No Paresthesias:No  Musculoskeletal: Strength & Muscle Tone: within normal limits Gait & Station: normal Patient leans: N/A  Psychiatric Specialty Exam: ROS  Blood pressure 120/76, pulse 90, temperature 98.4 F (36.9 C), temperature source Oral, weight 194 lb (88 kg).Body mass index is 36.66 kg/m.  General Appearance: Casual  Eye Contact:  Fair  Speech:  Slow  Volume:  Decreased  Mood:  Anxious  Affect:  Congruent  Thought Process:  Disorganized  Orientation:  Full (Time, Place, and Person)  Thought Content:  Hallucinations: Auditory  Suicidal Thoughts:  Yes.  without intent/plan  Homicidal Thoughts:  No  Memory:  Immediate;   Fair  Judgement:  Impaired  Insight:  Shallow  Psychomotor Activity:  Normal  Concentration:  Concentration: Fair and Attention Span: Fair  Recall:  [employer] of Knowledge:Fair  Language: Fair  Akathisia:  No  Handed:  Right  AIMS (if indicated):    Assets:  Communication Skills Desire for Improvement Physical Health Social  Support Transportation  ADL's:  Intact  Cognition: WNL  Sleep:      Treatment Plan Summary: Medication management  Pt  will be admitted to the inpatient behavioral health unit due to safety risk and is unable to contract for safety at this time. Discussed with her at length and she agreed with the plan. She will be taken to the inpatient unit for admission and her husband also agreed with the plan.  I called the inpatient unit and spoke with [facility]  about the same. She will be evaluated by Jerilynn Som for admission process  Continue meds as prescribed no changes in medications made at this time  Will follow up  after her inpatient admission    More than 50% of the time spent in psychoeducation, counseling and coordination of care.    This note was generated in part or whole with voice recognition software. Voice regonition is usually quite accurate but there are transcription errors that can and very often do occur. I apologize  for any typographical errors that were not detected and corrected.    Brandy Hale, MD 7/25/20173:14 PM

## 2015-12-15 NOTE — BH Assessment (Signed)
Assessment Note  Taylor Fleming is an 35 y.o. female who was referred to [facility] by her current outpatient provider (Dr. Garnetta Buddy). Patient was seen in her office today. While there, she reported SI. She told this Clinical research associate, she was had the plan of overdosing and or cutting her wrist. She's been inpatient in the past, due to similar presentations. She has had approximately 4 or 5 suicide attempts. Hospitalizations were in [place] state.  Patient denies the use of mind altering substances, no involvement with the legal system and no involvement with DSS.  She denies HI and AV/H. She have no history of aggression or violence.   Diagnosis: Depression  Past Medical History:  Past Medical History:  Diagnosis Date  . Bipolar 1 disorder (HCC)   . Depression   . Schizophrenia [facility])     Past Surgical History:  Procedure Laterality Date  . CESAREAN SECTION    . CHOLECYSTECTOMY      Family History:  Family History  Problem Relation Age of Onset  . Alcohol abuse Mother   . Drug abuse Mother   . Drug abuse Father   . Alcohol abuse Father   . Alcohol abuse Sister   . Drug abuse Sister     Social History:  reports that she has been smoking Cigarettes.  She started smoking about 23 years ago. She has been smoking about 1.00 pack per day. She has never used smokeless tobacco. She reports that she does not drink alcohol or use drugs.  Additional Social History:  Alcohol / Drug Use Pain Medications: None Reported Prescriptions: None Reported Over the Counter: None Reported History of alcohol / drug use?: No history of alcohol / drug abuse Longest period of sobriety (when/how long): Reports of no past or current use Negative Consequences of Use:  (Reports of none) Withdrawal Symptoms:  (Reports of none)  CIWA: CIWA-Ar BP: 134/72 Pulse Rate: 97 COWS:    Allergies:  Allergies  Allergen Reactions  . Penicillin G Swelling    Blisters in throat  . Penicillins Swelling  . Ibuprofen  Rash    Home Medications:  Medications Prior to Admission  Medication Sig Dispense Refill  . buPROPion (WELLBUTRIN) 75 MG tablet Take 1 tablet (75 mg total) by mouth daily after breakfast. 30 tablet 0  . divalproex (DEPAKOTE ER) 500 MG 24 hr tablet Take 1 tablet (500 mg total) by mouth daily. 30 tablet 0  . haloperidol (HALDOL) 2 MG tablet Take 1 tablet (2 mg total) by mouth at bedtime. 30 tablet 1  . linaclotide (LINZESS) 145 MCG CAPS capsule Take by mouth.    . traZODone (DESYREL) 100 MG tablet Take 1 tablet (100 mg total) by mouth at bedtime. 30 tablet 0    OB/GYN Status:  No LMP recorded.  General Assessment Data Location of Assessment: [facility] ED TTS Assessment: In system Is this a Tele or Face-to-Face Assessment?: Face-to-Face Is this an Initial Assessment or a Re-assessment for this encounter?: Initial Assessment Marital status: Separated Maiden name: n/a Is patient pregnant?: No Pregnancy Status: No Living Arrangements: Children, Other relatives Can pt return to current living arrangement?: Yes Admission Status: Voluntary Is patient capable of signing voluntary admission?: Yes Referral Source: Psychiatrist Insurance type: Medicare  Medical Screening Exam [place] [facility] ONLY) Medical Exam completed: Yes (Referral from Outpatient office )  Crisis Care Plan Living Arrangements: Children, Other relatives Legal Guardian: Other: (Reports of none) Name of Psychiatrist: Dr. Marquis Lunch. Faheem Name of Therapist: none  Education Status Is  patient currently in school?: No Current Grade: n/a Highest grade of school patient has completed: [employer] Name of school: n/a Contact person: n/a  Risk to self with the past 6 months Suicidal Ideation: Yes-Currently Present Has patient been a risk to self within the past 6 months prior to admission? : Yes Suicidal Intent: Yes-Currently Present Has patient had any suicidal intent within the past 6 months prior to admission? : Yes Is patient  at risk for suicide?: Yes Suicidal Plan?: Yes-Currently Present Has patient had any suicidal plan within the past 6 months prior to admission? : Yes Specify Current Suicidal Plan: cut wrist an or overdose Access to Means: Yes Specify Access to Suicidal Means: Sharp objects and medications What has been your use of drugs/alcohol within the last 12 months?: Reports of none Previous Attempts/Gestures: Yes How many times?: 5 Other Self Harm Risks: Reports of none Triggers for Past Attempts: Family contact, Other (Comment) (Depression) Intentional Self Injurious Behavior: None Family Suicide History: Unknown Recent stressful life event(s): Other (Comment) Persecutory voices/beliefs?: No Depression: Yes Depression Symptoms: Feeling angry/irritable, Feeling worthless/self pity, Loss of interest in usual pleasures, Guilt, Fatigue, Isolating, Tearfulness Substance abuse history and/or treatment for substance abuse?: Yes Suicide prevention information given to non-admitted patients: Not applicable  Risk to Others within the past 6 months Homicidal Ideation: No Does patient have any lifetime risk of violence toward others beyond the six months prior to admission? : No Thoughts of Harm to Others: No Current Homicidal Intent: No Current Homicidal Plan: No Access to Homicidal Means: No Identified Victim: Reports of none History of harm to others?: No Assessment of Violence: None Noted Violent Behavior Description: Reports of none Does patient have access to weapons?: No Criminal Charges Pending?: No Does patient have a court date: No Is patient on probation?: No  Psychosis Hallucinations: None noted  Mental Status Report Appearance/Hygiene: Unremarkable Eye Contact: Fair Motor Activity: Freedom of movement, Unremarkable Speech: Logical/coherent, Unremarkable Level of Consciousness: Alert Mood: Depressed, Helpless, Sad, Pleasant Affect: Appropriate to circumstance, Anxious,  Sad Anxiety Level: Minimal Thought Processes: Coherent, Relevant Judgement: Partial Orientation: Person, Place, Time, Situation, Appropriate for developmental age Obsessive Compulsive Thoughts/Behaviors: Minimal  Cognitive Functioning Concentration: Normal Memory: Recent Intact, Remote Intact IQ: Average Insight: Fair Impulse Control: Poor Appetite: Fair Weight Loss: 0 Weight Gain: 0 Sleep: Decreased Total Hours of Sleep: 6 Vegetative Symptoms: None  ADLScreening [facility] Assessment Services) Patient's cognitive ability adequate to safely complete daily activities?: Yes Patient able to express need for assistance with ADLs?: Yes Independently performs ADLs?: Yes (appropriate for developmental age)  Prior Inpatient Therapy Prior Inpatient Therapy: Yes Prior Therapy Dates: Patient states, she don't remember Prior Therapy Facilty/Provider(s): Patient states, she don't remember Reason for Treatment: Patient states, she don't remember  Prior Outpatient Therapy Prior Outpatient Therapy: Yes Prior Therapy Dates: Current Prior Therapy Facilty/Provider(s): Dr. Garnetta Buddy [facility]) Reason for Treatment: Depression Does patient have an ACCT team?: No Does patient have Intensive In-House Services?  : No Does patient have [facility] services? : No Does patient have P4CC services?: No  ADL Screening (condition at time of admission) Patient's cognitive ability adequate to safely complete daily activities?: Yes Is the patient deaf or have difficulty hearing?: No Does the patient have difficulty seeing, even when wearing glasses/contacts?: No Does the patient have difficulty concentrating, remembering, or making decisions?: No Patient able to express need for assistance with ADLs?: Yes Does the patient have difficulty dressing or bathing?: No Independently performs ADLs?: Yes (appropriate for  developmental age) Does the patient have difficulty walking or climbing stairs?:  No Weakness of Legs: None Weakness of Arms/Hands: None  Home Assistive Devices/Equipment Home Assistive Devices/Equipment: None  Therapy Consults (therapy consults require a physician order) PT Evaluation Needed: No OT Evalulation Needed: No SLP Evaluation Needed: No Abuse/Neglect Assessment (Assessment to be complete while patient is alone) Physical Abuse: Denies Verbal Abuse: Denies Sexual Abuse: Denies Exploitation of patient/patient's resources: Denies Self-Neglect: Denies Possible abuse reported to:: Other (Comment) (MD Feheem) Values / Beliefs Cultural Requests During Hospitalization: None Spiritual Requests During Hospitalization: None Consults Spiritual Care Consult Needed: No Social Work Consult Needed: No Merchant navy officer (For Healthcare) Does patient have an advance directive?: No Would patient like information on creating an advanced directive?: No - patient declined information Nutrition Screen- [facility] Adult/WL/AP Patient's home diet: Regular Has the patient recently lost weight without trying?: Yes, 2-13 lbs. Has the patient been eating poorly because of a decreased appetite?: No Malnutrition Screening Tool Score: 1  Additional Information 1:1 In Past 12 Months?: No CIRT Risk: No Elopement Risk: No Does patient have medical clearance?: Yes (Patient was direct admit)  Child/Adolescent Assessment Running Away Risk: Denies (Patient is adult)  Disposition:  Disposition Initial Assessment Completed for this Encounter: Yes Disposition of Patient: Inpatient treatment program (Per outpatient provider (Dr. Garnetta Buddy))  On Site Evaluation by:   Reviewed with Physician:    Lilyan Gilford MS, LCAS, LPC, NCC, CCSI Therapeutic Triage Specialist 12/15/2015 5:09 PM
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
## 2015-12-15 NOTE — Tx Team (Signed)
Initial Interdisciplinary Treatment Plan   PATIENT STRESSORS: Health problems Medication change or noncompliance   PATIENT STRENGTHS: Ability for insight Capable of independent living Communication skills Supportive family/friends   PROBLEM LIST: Problem List/Patient Goals Date to be addressed Date deferred Reason deferred Estimated date of resolution  Depression  12/15/15     Suicidal 12/15/15     Self Inflicted Wounds 12/15/15                                          DISCHARGE CRITERIA:  Ability to meet basic life and health needs Improved stabilization in mood, thinking, and/or behavior  PRELIMINARY DISCHARGE PLAN: Outpatient therapy Return to previous living arrangement  PATIENT/FAMIILY INVOLVEMENT: This treatment plan has been presented to and reviewed with the patient, Taylor Fleming, and/or family member, The patient and family have been given the opportunity to ask questions and make suggestions.  Jarrod Bodkins A Zarayah Lanting 12/15/2015, 4:53 PM
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
## 2015-12-16 ENCOUNTER — Encounter: Payer: Self-pay | Admitting: Psychiatry

## 2015-12-16 DIAGNOSIS — F314 Bipolar disorder, current episode depressed, severe, without psychotic features: Secondary | ICD-10-CM

## 2015-12-16 DIAGNOSIS — F17200 Nicotine dependence, unspecified, uncomplicated: Secondary | ICD-10-CM

## 2015-12-16 DIAGNOSIS — F603 Borderline personality disorder: Secondary | ICD-10-CM

## 2015-12-16 DIAGNOSIS — F319 Bipolar disorder, unspecified: Secondary | ICD-10-CM

## 2015-12-16 LAB — COMPREHENSIVE METABOLIC PANEL
ALK PHOS: 72 U/L (ref 38–126)
ALT: 14 U/L (ref 14–54)
AST: 14 U/L — ABNORMAL LOW (ref 15–41)
Albumin: 4.4 g/dL (ref 3.5–5.0)
Anion gap: 7 (ref 5–15)
BILIRUBIN TOTAL: 0.4 mg/dL (ref 0.3–1.2)
BUN: 12 mg/dL (ref 6–20)
CALCIUM: 9.4 mg/dL (ref 8.9–10.3)
CO2: 27 mmol/L (ref 22–32)
CREATININE: 0.82 mg/dL (ref 0.44–1.00)
Chloride: 104 mmol/L (ref 101–111)
GFR calc Af Amer: >60 mL/min (ref >60)
GFR calc non Af Amer: >60 mL/min (ref >60)
GLUCOSE: 99 mg/dL (ref 65–99)
Potassium: 4.3 mmol/L (ref 3.5–5.1)
Sodium: 138 mmol/L (ref 135–145)
Total Protein: 7.8 g/dL (ref 6.5–8.1)

## 2015-12-16 LAB — URINALYSIS COMPLETE WITH MICROSCOPIC (ARMC ONLY)
BILIRUBIN URINE: NEGATIVE
GLUCOSE, UA: NEGATIVE mg/dL
Hgb urine dipstick: NEGATIVE
Ketones, ur: NEGATIVE mg/dL
LEUKOCYTES UA: NEGATIVE
Nitrite: NEGATIVE
PH: 7 (ref 5.0–8.0)
Protein, ur: NEGATIVE mg/dL
RBC / HPF: NONE SEEN RBC/hpf (ref 0–5)
Specific Gravity, Urine: 1.009 (ref 1.005–1.030)

## 2015-12-16 LAB — CBC
HEMATOCRIT: 40.7 % (ref 35.0–47.0)
HEMOGLOBIN: 14.1 g/dL (ref 12.0–16.0)
MCH: 31.1 pg (ref 26.0–34.0)
MCHC: 34.8 g/dL (ref 32.0–36.0)
MCV: 89.6 fL (ref 80.0–100.0)
Platelets: 259 10*3/uL (ref 150–440)
RBC: 4.54 MIL/uL (ref 3.80–5.20)
RDW: 13.9 % (ref 11.5–14.5)
WBC: 8.8 10*3/uL (ref 3.6–11.0)

## 2015-12-16 LAB — URINE DRUG SCREEN, QUALITATIVE (ARMC ONLY)
AMPHETAMINES, UR SCREEN: NOT DETECTED
BENZODIAZEPINE, UR SCRN: NOT DETECTED
Barbiturates, Ur Screen: NOT DETECTED
Cannabinoid 50 Ng, Ur ~~LOC~~: NOT DETECTED
Cocaine Metabolite,Ur ~~LOC~~: NOT DETECTED
MDMA (Ecstasy)Ur Screen: NOT DETECTED
METHADONE SCREEN, URINE: NOT DETECTED
OPIATE, UR SCREEN: NOT DETECTED
Phencyclidine (PCP) Ur S: NOT DETECTED
TRICYCLIC, UR SCREEN: NOT DETECTED

## 2015-12-16 LAB — LIPID PANEL
CHOL/HDL RATIO: 4.1 ratio
CHOLESTEROL: 170 mg/dL (ref 0–200)
HDL: 41 mg/dL (ref >40)
LDL Cholesterol: 111 mg/dL — ABNORMAL HIGH (ref 0–99)
Triglycerides: 90 mg/dL (ref <150)
VLDL: 18 mg/dL (ref 0–40)

## 2015-12-16 LAB — PREGNANCY, URINE: PREG TEST UR: NEGATIVE

## 2015-12-16 LAB — VITAMIN B12: VITAMIN B 12: 408 pg/mL (ref 180–914)

## 2015-12-16 LAB — TSH: TSH: 1.594 u[IU]/mL (ref 0.350–4.500)

## 2015-12-16 MED ORDER — ZIPRASIDONE HCL 40 MG PO CAPS
40.0000 mg | ORAL_CAPSULE | Freq: Every day | ORAL | Status: DC
  Administered 2015-12-16 – 2015-12-17 (×2): 40 mg via ORAL
  Filled 2015-12-16 (×2): qty 1

## 2015-12-16 MED ORDER — NICOTINE 21 MG/24HR TD PT24
21.0000 mg | MEDICATED_PATCH | Freq: Every day | TRANSDERMAL | Status: DC
  Administered 2015-12-16 – 2015-12-18 (×3): 21 mg via TRANSDERMAL
  Filled 2015-12-16 (×3): qty 1

## 2015-12-16 NOTE — Plan of Care (Signed)
Problem: Coping: Goal: Ability to cope will improve Outcome: Not Progressing Patient not able to cope as of yet due to lack of learned coping skills on the unit at this time [facility] RN

## 2015-12-16 NOTE — B[facility] Group Notes (Signed)
[facility] LCSW Group Therapy   12/16/2015  9:30am   Type of Therapy: Group Therapy   Participation Level: Active   Participation Quality: Attentive, Sharing and Supportive   Affect: Depressed and Flat   Cognitive: Alert and Oriented   Insight: Developing/Improving and Engaged   Engagement in Therapy: Developing/Improving and Engaged   Modes of Intervention: Clarification, Confrontation, Discussion, Education, Exploration, Limit-setting, Orientation, Problem-solving, Rapport Building, Dance movement psychotherapist, Socialization and Support   Summary of Progress/Problems: The topic for group today was emotional regulation. This group focused on both positive and negative emotion identification and allowed group members to process ways to identify feelings, regulate negative emotions, and find healthy ways to manage internal/external emotions. Group members were asked to reflect on a time when their reaction to an emotion led to a negative outcome and explored how alternative responses using emotion regulation would have benefited them. Group members were also asked to discuss a time when emotion regulation was utilized when a negative emotion was experienced. Pt reported a healthy coping mechanism the pt uses to cope in a crisis is listening to music that the pt can relate to at the time of the pt's crisis.  The pt reported that using the pt's primary coping mechanism assists the pt in feeling "less alone" with the pt's problems.  Pt reported that the pt's primary goal is successful medication management.      Dorothe Pea. Valyn Latchford, MSW, LCSWA, LCAS

## 2015-12-16 NOTE — [facility] Suicide Risk Assessment (Signed)
[facility] INPATIENT:  Family/Significant Other Suicide Prevention Education  Suicide Prevention Education:  Patient Refusal for Family/Significant Other Suicide Prevention Education: The patient Taylor Fleming has refused to provide written consent for family/significant other to be provided Family/Significant Other Suicide Prevention Education during admission and/or prior to discharge.  Physician notified. Patient reports that she is able to handle her own care at this time. Does not want to worry family, just wants medications adjusted at this time.  Lynden Oxford, MSW, LCSW-A 12/16/2015, 11:45 AM

## 2015-12-16 NOTE — Progress Notes (Signed)
D:  Taylor Fleming SI/HI/AVH.  Verbalized that she is depressed.  Affect sad.  Firs part of the day isolated to room.  After lunch more visible in the milieu, smiling more.  Interacting with peers.   A:  Support and encouragement offered.  Safety maintained. Scheduled medications given. R.  Medication compliant. Safety maintained.

## 2015-12-16 NOTE — H&P (Signed)
Psychiatric Admission Assessment Adult  Patient Identification: Taylor Fleming MRN:  [id number] Date of Evaluation:  12/16/2015 Chief Complaint:  Depression Principal Diagnosis: Bipolar disorder [facility]) Diagnosis:   Patient Active Problem List   Diagnosis Date Noted  . Borderline personality disorder [F60.3] 12/16/2015  . Bipolar disorder (HCC) [F31.9] 12/16/2015  . Tobacco use disorder [F17.200] 12/16/2015  . Headache, migraine [G43.909] 04/02/2015   History of Present Illness:   The patient is a 35 year old separated Caucasian female from [facility]. Patient was referred for psychiatric admission by her outpatient psychiatrist Dr. Garnetta Buddy.  Patient  was seeing yesterday at the psychiatric outpatient clinic. She reported that she has been feeling very depressed and has been having negative thoughts including thoughts to hurt herself. She reported that she is getting worse. Patient reported that she is unable to control herself and she wakes up every morning with the thought to hurt herself. She is having relationship issues with her family members and she is unable to contract for safety at this time.   Today during assessment the patient was uncooperative. She says she was angry and did not feel like answering many of the questions. She was not willing to discuss in detail her past psychiatric history neither the current stressors that are causing the worsening in her depression.  Patient says her main goal is to have the Depakote discontinued as she blames it for the worsening of mood. The patient says that she has been unable to abstain from cutting herself for 10 years but just recently started cutting again. Patient says that Depakote was recently increased from 500 mg 2 at thousand milligrams and so after that to place her mood worsen.  Patient denies today having suicidality homicidality or having auditory or visual hallucinations. She also denies having any urge or  psychotic. She however reported feeling very angry and she greatly her depression as an 8 out of 10 x 10 being the worst. She says she has been is sleeping excessively at home. She says that she has been having to force herself to eat and feels that she has no energy and no ability to concentrate on things.  As far as substance abuse patient states that she had issues with alcohol in the past but recently she is only drinking once or twice a month and is usually only 1 drink. The patient reports smoking marijuana in the past. Currently she is not using any illicit substances. She smokes heavily about 1-1/2 packs of cigarettes per day  Trauma history patient was uncooperative during assessment so we didn't discuss issues with trauma. However later on the patient mentioned that her mother committed suicide when the patient was 35 years old.    Associated Signs/Symptoms: Depression Symptoms:  depressed mood, hypersomnia, fatigue, hopelessness, recurrent thoughts of death, (Hypo) Manic Symptoms:  Irritable Mood, Anxiety Symptoms:  Excessive Worry, Psychotic Symptoms:  Hallucinations: None PTSD Symptoms: NA Total Time spent with patient: 1 hour  Past Psychiatric History: multiple psychiatric hospitalizations before age 74. Reports having many suicidal attempts asa child. H/o self injury but was able not to hurt herself for 10 years.  Just started seeing [facility] 2 months ago.  Sees a therapist at [facility].  Takes wellbutrin 75 mg q am, depakote 500 mg qhs, hadol 2 mg qhs and trazodone 150 mg    Is the patient at risk to self? Yes.    Has the patient been a risk to self in the past 6 months? No.  Has the patient been a risk to self within the distant past? Yes.    Is the patient a risk to others? No.  Has the patient been a risk to others in the past 6 months? No.  Has the patient been a risk to others within the distant past? No.    Past Medical History:  denies Past Medical History:  Diagnosis Date  . Bipolar 1 disorder (HCC)   . Depression   . Schizophrenia [facility])     Past Surgical History:  Procedure Laterality Date  . CESAREAN SECTION    . CHOLECYSTECTOMY     Family History:  Family History  Problem Relation Age of Onset  . Alcohol abuse Mother   . Drug abuse Mother   . Drug abuse Father   . Alcohol abuse Father   . Alcohol abuse Sister   . Drug abuse Sister    Family Psychiatric  History: pt's mother committed suicide when pt was 68 y/o.  Says she has multiple relatives with bipolar and schizophrenia  Tobacco Screening: smokes 1 and 1/2 packs per day  Social History: Pt is separated, has a 75 y/o son. Receives disability since childhood for mental health issues.  Lives with multiple relatives and her son here in [place].  Denies legal history. Education: 2 years of technical college History  Alcohol Use No     History  Drug Use No    Additional Social History: Marital status: Separated Are you sexually active?: No What is your sexual orientation?: Straight  Has your sexual activity been affected by drugs, alcohol, medication, or emotional stress?: Unknwown  Does patient have children?: Yes How many children?: 1 How is patient's relationship with their children?: The relationship is fine.    Pain Medications: None Reported Prescriptions: None Reported Over the Counter: None Reported History of alcohol / drug use?: No history of alcohol / drug abuse Longest period of sobriety (when/how long): Reports of no past or current use Negative Consequences of Use:  (Reports of none) Withdrawal Symptoms:  (Reports of none)      Allergies:   Allergies  Allergen Reactions  . Penicillin G Swelling    Blisters in throat  . Penicillins Swelling  . Ibuprofen Rash   Lab Results: No results found for this or any previous visit (from the past 48 hour(s)).  Blood Alcohol level:  No results found for: [facility]  Metabolic  Disorder Labs:  No results found for: HGBA1C, MPG No results found for: PROLACTIN No results found for: CHOL, TRIG, HDL, CHOLHDL, VLDL, LDLCALC  Current Medications: Current Facility-Administered Medications  Medication Dose Route Frequency Provider Last Rate Last Dose  . acetaminophen (TYLENOL) tablet 650 mg  650 mg Oral Q6H PRN Jimmy Footman, MD      . alum & mag hydroxide-simeth (MAALOX/MYLANTA) 200-200-20 MG/5ML suspension 30 mL  30 mL Oral Q4H PRN Jimmy Footman, MD      . buPROPion [facility]) tablet 75 mg  75 mg Oral QPC breakfast Jimmy Footman, MD   75 mg at 12/16/15 0939  . linaclotide (LINZESS) capsule 145 mcg  145 mcg Oral QAC breakfast Jimmy Footman, MD      . magnesium hydroxide (MILK OF MAGNESIA) suspension 30 mL  30 mL Oral Daily PRN Jimmy Footman, MD      . nicotine (NICODERM CQ - dosed in mg/24 hours) patch 21 mg  21 mg Transdermal Daily Jimmy Footman, MD      . traZODone (DESYREL) tablet 100 mg  100  mg Oral QHS Jimmy Footman, MD   100 mg at 12/16/15 0050  . ziprasidone (GEODON) capsule 40 mg  40 mg Oral Q supper Jimmy Footman, MD       PTA Medications: Prescriptions Prior to Admission  Medication Sig Dispense Refill Last Dose  . buPROPion (WELLBUTRIN) 75 MG tablet Take 1 tablet (75 mg total) by mouth daily after breakfast. 30 tablet 0 Taking  . divalproex (DEPAKOTE ER) 500 MG 24 hr tablet Take 1 tablet (500 mg total) by mouth daily. 30 tablet 0 Taking  . haloperidol (HALDOL) 2 MG tablet Take 1 tablet (2 mg total) by mouth at bedtime. 30 tablet 1 Taking  . linaclotide (LINZESS) 145 MCG CAPS capsule Take by mouth.   Taking  . traZODone (DESYREL) 100 MG tablet Take 1 tablet (100 mg total) by mouth at bedtime. 30 tablet 0 Taking    Musculoskeletal: Strength & Muscle Tone: within normal limits Gait & Station: normal Patient leans: N/A  Psychiatric Specialty Exam: Physical Exam   Constitutional: She is oriented to person, place, and time. She appears well-developed and well-nourished.  HENT:  Head: Normocephalic and atraumatic.  Eyes: EOM are normal.  Neck: Normal range of motion.  Respiratory: Effort normal.  Musculoskeletal: Normal range of motion.  Neurological: She is alert and oriented to person, place, and time.    Review of Systems  Constitutional: Negative.   HENT: Negative.   Eyes: Negative.   Respiratory: Negative.   Cardiovascular: Negative.   Gastrointestinal: Negative.   Genitourinary: Negative.   Musculoskeletal: Negative.   Skin: Negative.     Blood pressure (!) 104/58, pulse 87, temperature 98.1 F (36.7 C), temperature source Oral, resp. rate 18, height 5\' 1"  (1.549 m), weight 88 kg (194 lb), SpO2 99 %.Body mass index is 36.66 kg/m.  General Appearance: Fairly Groomed  Eye Contact:  Good  Speech:  Clear and Coherent  Volume:  Normal  Mood:  Irritable  Affect:  Constricted  Thought Process:  Linear and Descriptions of Associations: Intact  Orientation:  Full (Time, Place, and Person)  Thought Content:  Hallucinations: None  Suicidal Thoughts:  No  Homicidal Thoughts:  No  Memory:  Immediate;   Fair Recent;   Fair Remote;   Fair  Judgement:  Poor  Insight:  Shallow  Psychomotor Activity:  Decreased  Concentration:  Concentration: Fair and Attention Span: Fair  Recall:  [employer] of Knowledge:  Good  Language:  Fair  Akathisia:  No  Handed:    AIMS (if indicated):     Assets:  Manufacturing systems engineer Physical Health Social Support  ADL's:  Intact  Cognition:  WNL  Sleep:  Number of Hours: 8.15       Treatment Plan Summary:  Bipolar d/o: continue pt on wellbutrin 75 mg po q day. Pt request to have depakote d/c as she feels this medication is worsening her mood.  Instead will start geodon 40 mg with dinner to target anger, irritability. Haldol will be d/c  Insomnia: continue trazodone   Borderline personality d/o: pt  will be encourage to continue individual therapy upon discharge.  Pt has been assigned to group psychotherapy while in the hospital.  Tobacco use disorder: pt has been started on nicotine patch of 21 mg q day  Constipation: continue linzess po q day  Dispo: back home with family once stable  F/u will continue to f/u with [facility] and [facility] for therapy  Labs: will order hbA1c, lipid panel, prolactin,  TSH, b12, Utox, UA, urine pregnancy.   I certify that inpatient services furnished can reasonably be expected to improve the patient's condition.    Jimmy Footman, MD 7/26/201711:41 AM

## 2015-12-16 NOTE — Progress Notes (Signed)
D: Patient is alert and oriented on the unit this shift. Patient attended   in groups today but not participated.. Patient denies suicidal ideation, homicidal ideation, auditory or visual hallucinations at the present time.  A: Scheduled medications are administered to patient as per MD orders. Emotional support and encouragement are provided. Patient is maintained on q.15 minute safety checks. Patient is informed to notify staff with questions or concerns. R: No adverse medication reactions are noted. Patient is cooperative with medication administration and part of treatment plan today. Patient is anxious but  cooperative on the unit at this time. Patient does not interact  with others on the unit this shift. Patient contracts for safety at this time. Patient remains safe at this time. Anxiety 5/10 Depression 5/10

## 2015-12-16 NOTE — Tx Team (Signed)
Interdisciplinary Treatment Plan Update (Adult)         Date: 12/16/2015   Time Reviewed: 10:30 AM   Progress in Treatment: Improving Attending groups: Yes  Participating in groups: Yes  Taking medication as prescribed: Yes  Tolerating medication: Yes  Family/Significant other contact made: No, CSW is assessing proper contacts Patient understands diagnosis: Yes  Discussing patient identified problems/goals with staff: Yes  Medical problems stabilized or resolved: Yes  Denies suicidal/homicidal ideation: Yes  Issues/concerns per patient self-inventory: Yes  Other:   New problem(s) identified: N/A   Discharge Plan or Barriers: see below   Reason for Continuation of Hospitalization:   Depression   Anxiety   Medication Stabilization   Comments: N/A   Estimated length of stay: 3-5 days    Patient is a 35 year old female admitted for depression and suicidal thoughts. Patient lives in [place], [place].  She is having relationship issues with her family members and she is unable to contract for safety at this time.  Her husband was also present during the interview reported that he is concerned about her behavior as well. Patient reported that she is compliant with the medications but they are not helping her. Her 24 year old son just returned from [place] and she was concerned about him as he had a good time with the grandfather. The family is planning to go to [place] next weekend.  Patient will benefit from crisis stabilization, medication evaluation, group therapy, and psycho education in addition to case management for discharge planning. Patient and CSW reviewed pt's identified goals and treatment plan. Pt verbalized understanding and agreed to treatment plan.    Review of initial/current patient goals per problem list:  1. Goal(s): Patient will participate in aftercare plan   Met: Yes  Target date: 3-5 days post admission date   As evidenced by: Patient will  participate within aftercare plan AEB aftercare provider and housing plan at discharge being identified.   Patient will follow-up with [facility]   2. Goal (s): Patient will exhibit decreased depressive symptoms and suicidal ideations.   Met: No  Target date: 3-5 days post admission date   As evidenced by: Patient will utilize self-rating of depression at 3 or below and demonstrate decreased signs of depression or be deemed stable for discharge by MD.   Patient reports a depression of a 5 at this time.   3. Goal(s): Patient will demonstrate decreased signs and symptoms of anxiety.   Met: Goal progressing  Target date: 3-5 days post admission date   As evidenced by: Patient will utilize self-rating of anxiety at 3 or below and demonstrated decreased signs of anxiety, or be deemed stable for discharge by MD   Patient reports an anxiety score of 4 at this time.   Attendees:  Patient:  Family:  Physician: Dr. Merlyn Albert , MD     12/16/2015 10:30 AM  Nursing: Polly Cobia, RN       12/16/2015 10:30 AM  Clinical Social Worker: Emilie Rutter, LCSWA  12/16/2015 10:30AM

## 2015-12-16 NOTE — BHH Suicide Risk Assessment (Signed)
[facility] Admission Suicide Risk Assessment   Nursing information obtained from:    Demographic factors:    Current Mental Status:    Loss Factors:    Historical Factors:    Risk Reduction Factors:     Total Time spent with patient: 1 hour Principal Problem: Bipolar disorder (HCC) Diagnosis:   Patient Active Problem List   Diagnosis Date Noted  . Borderline personality disorder [F60.3] 12/16/2015  . Bipolar disorder (HCC) [F31.9] 12/16/2015  . Tobacco use disorder [F17.200] 12/16/2015  . Headache, migraine [G43.909] 04/02/2015   Subjective Data:   Continued Clinical Symptoms:  Alcohol Use Disorder Identification Test Final Score (AUDIT): 1 The "Alcohol Use Disorders Identification Test", Guidelines for Use in Primary Care, Second Edition.  World Science writer [facility]). Score between 0-7:  no or low risk or alcohol related problems. Score between 8-15:  moderate risk of alcohol related problems. Score between 16-19:  high risk of alcohol related problems. Score 20 or above:  warrants further diagnostic evaluation for alcohol dependence and treatment.   CLINICAL FACTORS:   Severe Anxiety and/or Agitation Bipolar Disorder:   Depressive phase Personality Disorders:   Cluster B Comorbid depression More than one psychiatric diagnosis Previous Psychiatric Diagnoses and Treatments     Psychiatric Specialty Exam: Physical Exam  ROS  Blood pressure (!) 104/58, pulse 87, temperature 98.1 F (36.7 C), temperature source Oral, resp. rate 18, height 5\' 1"  (1.549 m), weight 88 kg (194 lb), SpO2 99 %.Body mass index is 36.66 kg/m.                                                    Sleep:  Number of Hours: 8.15      COGNITIVE FEATURES THAT CONTRIBUTE TO RISK:  Closed-mindedness    SUICIDE RISK:   Moderate:  Frequent suicidal ideation with limited intensity, and duration, some specificity in terms of plans, no associated intent, good self-control, limited  dysphoria/symptomatology, some risk factors present, and identifiable protective factors, including available and accessible social support.   PLAN OF CARE: admit to [facility]  I certify that inpatient services furnished can reasonably be expected to improve the patient's condition.  Jimmy Footman, MD 12/16/2015, 11:39 AM

## 2015-12-16 NOTE — Progress Notes (Signed)
D: Pt SI-contracts, pt emotional on approach, stated she will come to writer if she needed anything. denies HI/AVH. Pt is pleasant and cooperative.  A: Pt was offered support and encouragement. Pt was given scheduled medications. Pt was encourage to attend groups. Q 15 minute checks were done for safety.   R:  Pt is taking medication. Pt has no complaints.Pt receptive to treatment and safety maintained on unit.

## 2015-12-16 NOTE — BHH Counselor (Signed)
Adult Comprehensive Assessment  Patient ID: Taylor Fleming, female   DOB: 18-Aug-1980, 35 y.o.   MRN: [id number]  Information Source: Information source: Patient  Current Stressors:  Educational / Learning stressors: No stressors identified  Employment / Job issues: No stressors identified  Family Relationships: Lives with immediate family but reports stressors that she does not care to discuss  Surveyor, quantity / Lack of resources (include bankruptcy): No stressors identified - receives [employer] / Lack of housing: No stressors identified  Physical health (include injuries & life threatening diseases): No stressors identified  Social relationships: No stressors identified  Substance abuse: Past history of alcohol abuse - now only drinks 2 drinks a month  Bereavement / Loss: Lost her mother 10 years ago to suicide   Living/Environment/Situation:  Living Arrangements: Children, Other relatives Living conditions (as described by patient or guardian): "They are alright, some stress but it's fine" How long has patient lived in current situation?: 1 year and a half What is atmosphere in current home: Chaotic, Supportive  Family History:  Marital status: Separated Are you sexually active?: No What is your sexual orientation?: Straight  Has your sexual activity been affected by drugs, alcohol, medication, or emotional stress?: Unknwown  Does patient have children?: Yes How many children?: 1 How is patient's relationship with their children?: The relationship is fine.  Childhood History:  By whom was/is the patient raised?: Mother (Relatives) Description of patient's relationship with caregiver when they were a child: Pt refused to discuss  Patient's description of current relationship with people who raised him/her: Pt refused to discuss - lost mother to suicide when pt wsa 18 years old. How were you disciplined when you got in trouble as a child/adolescent?: Unknown Did patient suffer any  verbal/emotional/physical/sexual abuse as a child?:  (Pt refused to discuss ) Did patient suffer from severe childhood neglect?:  (Pt refused to discuss) Has patient ever been sexually abused/assaulted/raped as an adolescent or adult?:  (Pt refused to discuss ) Was the patient ever a victim of a crime or a disaster?:  (Pt refused to discuss ) Witnessed domestic violence?:  (Pt refused to discuss ) Has patient been effected by domestic violence as an adult?:  (Pt refused to discuss )  Education:  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 Currently a student?: No Name of school: n/a Solicitor person: n/a  Employment/Work Situation:   Employment situation: On disability Why is patient on disability: Mental illness How long has patient been on disability: Since age 89 Patient's job has been impacted by current illness: Yes Describe how patient's job has been impacted: Not being able to work due to diagnoses  What is the longest time patient has a held a job?: 2 1/2 years  Where was the patient employed at that time?: In [place]  Has patient ever been in the [employer]?: No Has patient ever served in combat?: No Did You Receive Any Psychiatric Treatment/Services While in Equities trader?: No Are There Guns or Other Weapons in Your Home?: No Are These Comptroller?:  (N/A)  Financial Resources:   Financial resources: Insurance claims handler, Medicare  Alcohol/Substance Abuse:   What has been your use of drugs/alcohol within the last 12 months?: Social drinking - maybe 2 drinks a month If attempted suicide, did drugs/alcohol play a role in this?: No Alcohol/Substance Abuse Treatment Hx: Past Tx, Outpatient If yes, describe treatment: Alcohol treatment  Has alcohol/substance abuse ever caused legal problems?: No  Social Support System:  Patient's Community Support System: Fair Museum/gallery exhibitions officer System: Has immediate family since moving to [place] about a 1 year  and a half ago. Type of faith/religion: Unknown How does patient's faith help to cope with current illness?: N/A  Leisure/Recreation:   Leisure and Hobbies: "whatever i'm in the mood for at the time"  Strengths/Needs:   What things does the patient do well?: Unknown In what areas does patient struggle / problems for patient: Getting on the right medication  Discharge Plan:   Does patient have access to transportation?: Yes Will patient be returning to same living situation after discharge?: Yes (Home with family ) Currently receiving community mental health services: Yes (From Whom) Does patient have financial barriers related to discharge medications?: No  Summary/Recommendations:   Summary and Recommendations (to be completed by the evaluator): Patient presented to the hospital voluntarily accompanied by her husband. Patient is a 30 year old woman with a history depression, personality disorder, and schizoaffective disorder. She stated "the only reason I am here is because of medications." Pt reports primary triggers for admission was the new medications that she was prescribed such as Depakote which is giving her negative side effects. Patient lives in [place], [place] with her immediate family (cousins, uncle, aunt, and son). Pt states that her support system is her immediate family despite the recent family problems. Patient will benefit from crisis stabilization, medication evaluation, group therapy, and psycho education in addition to case management for discharge planning. Patient and CSW reviewed pt's identified goals and treatment plan. Pt verbalized understanding and agreed to treatment plan.  At discharge it is recommended that patient remain compliant with established plan and continue treatment.  Lynden Oxford, MSW, LCSW-A  12/16/2015

## 2015-12-16 NOTE — BHH Group Notes (Signed)
BHH Group Notes:  (Nursing/MHT/Case Management/Adjunct)  Date:  12/16/2015  Time:  6:59 AM  Type of Therapy:  Group Therapy  Participation Level:  Active  Participation Quality:  Appropriate  Affect:  Appropriate  Cognitive:  Appropriate  Insight:  Appropriate  Engagement in Group:  Engaged  Modes of Intervention:  Activity  Summary of Progress/Problems:  Burt Ek 12/16/2015, 6:59 AM

## 2015-12-16 NOTE — Care Management (Signed)
Taylor Fleming is doing better emotionally. She is still dealing with anxiety and frustration because of her current situation and possible placement upon leaving, but she says she feels much better this morning. The Chaplain was with and spoke with Taylor Fleming yesterday evening as well about anxiety.

## 2015-12-17 LAB — HEMOGLOBIN A1C: HEMOGLOBIN A1C: 4.6 % (ref 4.0–6.0)

## 2015-12-17 LAB — PROLACTIN: PROLACTIN: 27.5 ng/mL — AB (ref 4.8–23.3)

## 2015-12-17 MED ORDER — ZIPRASIDONE HCL 40 MG PO CAPS: 40.0000 mg | ORAL_CAPSULE | Freq: Every day | ORAL | 0 refills | Status: DC

## 2015-12-17 MED ORDER — AMANTADINE HCL 100 MG PO CAPS: 100.0000 mg | ORAL_CAPSULE | Freq: Every day | ORAL | 0 refills | Status: DC

## 2015-12-17 MED ORDER — AMANTADINE HCL 100 MG PO CAPS
100.0000 mg | ORAL_CAPSULE | Freq: Every day | ORAL | Status: DC
  Administered 2015-12-17: 100 mg via ORAL
  Filled 2015-12-17: qty 1

## 2015-12-17 MED ORDER — PNEUMOCOCCAL VAC POLYVALENT 25 MCG/0.5ML IJ INJ
0.5000 mL | INJECTION | INTRAMUSCULAR | Status: AC
  Administered 2015-12-17: 0.5 mL via INTRAMUSCULAR

## 2015-12-17 NOTE — [facility] Group Notes (Signed)
Goals Group Date/Time: 12/17/2015 9:00am Type of Therapy and Topic: Group Therapy: Goals Group: SMART Goals   Participation Level: Moderate  Description of Group:    The purpose of a daily goals group is to assist and guide patients in setting recovery/wellness-related goals. The objective is to set goals as they relate to the crisis in which they were admitted. Patients will be using SMART goal modalities to set measurable goals. Characteristics of realistic goals will be discussed and patients will be assisted in setting and processing how one will reach their goal. Facilitator will also assist patients in applying interventions and coping skills learned in psycho-education groups to the SMART goal and process how one will achieve defined goal.   Therapeutic Goals:   -Patients will develop and document one goal related to or their crisis in which brought them into treatment.  -Patients will be guided by LCSW using SMART goal setting modality in how to set a measurable, attainable, realistic and time sensitive goal.  -Patients will process barriers in reaching goal.  -Patients will process interventions in how to overcome and successful in reaching goal.   Patient's Goal: Pt shared the pt's goal is to discharge.  Pt shared the pt's primary motivation for achieving the pt's goal is the welfare of the pt's child.  Pt was polite and cooperative with the CSW and other group members and focused and attentive to the topics discussed and the sharing of others.      Therapeutic Modalities:  Motivational Interviewing  Research officer, political party  SMART goals setting   Dorothe Pea. Arella Blinder, LCSWA, LCAS

## 2015-12-17 NOTE — Progress Notes (Signed)
D:  Pleasant and cooperative.  Denies SI/HI/AVH.  Affect brighter.  Isolates to her room.  Inquired if she was feeling okay states "yes, I just do better alone.  I tend to be a loner."   A:  Support and encouragement offered.  Scheduled medications given. Q 15 minute rounding done.  Encouraged group attendance.  R:  Receptive to treatment regiment.  Safety maintained.

## 2015-12-17 NOTE — [facility] Group Notes (Signed)
[facility] LCSW Group Therapy   12/17/2015 9:30 am   Type of Therapy: Group Therapy   Participation Level: Active   Participation Quality: Attentive, Sharing and Supportive   Affect: Appropriate   Cognitive: Alert and Oriented   Insight: Developing/Improving and Engaged   Engagement in Therapy: Developing/Improving and Engaged   Modes of Intervention: Clarification, Confrontation, Discussion, Education, Exploration, Limit-setting, Orientation, Problem-solving, Rapport Building, Dance movement psychotherapist, Socialization and Support   Summary of Progress/Problems: The topic for group was balance in life. Today's group focused on defining balance in one's own words, identifying things that can knock one off balance, and exploring healthy ways to maintain balance in life. Group members were asked to provide an example of a time when they felt off balance, describe how they handled that situation, and process healthier ways to regain balance in the future. Group members were asked to share the most important tool for maintaining balance that they learned while at [facility] and how they plan to apply this method after discharge. Pt reported that for the pt attaining balance in the pt's life meant providing for the pt's son, but that the pt was focusing on discharge at the present.  Pt shared that on one occasion due to concern for the pt's son the pt acted out and could not see the pt's son and as a result, the pt felt "out of balance".  Pt shared that the pt's primary tool for achieving balance is to focus on the welfare of the pt's son.  Pt was polite and cooperative with the CSW and other group members and focused and attentive to the topics discussed and the sharing of others.  Pt seems to have made great improvement during group, as evidenced by increased sharing, sharing at length when prompted and pt presents a happy and increasingly energetic, as compared to previous sessions.  CSW actively validated the pt's opinion  and provided feedback.  Dorothe Pea. Clarke Amburn, LCSWA, LCAS

## 2015-12-17 NOTE — Progress Notes (Signed)
  [facility] Adult Case Management Discharge Plan :  Will you be returning to the same living situation after discharge:  Yes,  home with family At discharge, do you have transportation home?: Yes,  uncle will pick pt up Do you have the ability to pay for your medications: Yes,  Medicare coverage  Release of information consent forms completed and in the chart;  Patient's signature needed at discharge.  Patient to Follow up at: Follow-up Information    [facility]. Go on 12/21/2015.   Why:  Please arrive to your psychiatrist appointment for hospital follow-up at [facility] at 1:15pm. It is important that you bring your discharge paperworkwith you to your appointment.  Contact information: [address], [place], [place] [address] Hours: 8:30AM-5PM Phone: [phone] Fax: [phone]          Next level of care provider has access to [facility] Link:yes  Safety Planning and Suicide Prevention discussed: Yes,  SPE reviewed with patient  Have you used any form of tobacco in the last 30 days? (Cigarettes, Smokeless Tobacco, Cigars, and/or Pipes): Yes  Has patient been referred to the Quitline?: Patient refused referral  Patient has been referred for addiction treatment: N/A  Lynden Oxford, MSW, LCSW-A 12/18/15  9:21AM

## 2015-12-17 NOTE — [facility] Suicide Risk Assessment (Signed)
[facility] Discharge Suicide Risk Assessment   Principal Problem: Bipolar disorder [facility]) Discharge Diagnoses:  Patient Active Problem List   Diagnosis Date Noted  . Borderline personality disorder [F60.3] 12/16/2015  . Bipolar disorder (HCC) [F31.9] 12/16/2015  . Tobacco use disorder [F17.200] 12/16/2015  . Headache, migraine [G43.909] 04/02/2015      Psychiatric Specialty Exam: ROS  Blood pressure (!) 99/57, pulse 86, temperature 98 F (36.7 C), temperature source Oral, resp. rate 18, height 5\' 1"  (1.549 m), weight 88 kg (194 lb), SpO2 99 %.Body mass index is 36.66 kg/m.                                                       Mental Status Per Nursing Assessment::   On Admission:     Demographic Factors:  Caucasian  Loss Factors: NA  Historical Factors: Prior suicide attempts, Family history of suicide and Impulsivity  Risk Reduction Factors:   Responsible for children under 52 years of age, Sense of responsibility to family and Positive social support  Continued Clinical Symptoms:  Depression:   Severe Personality Disorders:   Cluster B Comorbid depression Previous Psychiatric Diagnoses and Treatments  Cognitive Features That Contribute To Risk:  None    Suicide Risk:  Minimal: No identifiable suicidal ideation.  Patients presenting with no risk factors but with morbid ruminations; may be classified as minimal risk based on the severity of the depressive symptoms  Follow-up Information    [facility]. Go on 12/21/2015.   Why:  Please arrive to your psychiatrist appointment for hospital follow-up at [facility] at 1:15pm. It is important that you bring your discharge paperworkwith you to your appointment.  Contact information: [address], [place], [place] [address] Hours: 8:30AM-5PM Phone: [phone] Fax: [phone]           Jimmy Footman,  MD 12/18/2015, 10:48 AM

## 2015-12-17 NOTE — Progress Notes (Signed)
[facility] MD Progress Note  12/17/2015 11:13 AM Taylor Fleming  MRN:  [id number] Subjective:  Patient reports improvement in mood. She does not feel as depressed and as angry as she was yesterday. She has been attending groups but has not really been participating. She wasn't started last night and Geodon. She reports no side effects or problems after taking the medication for the first time. Today she denies major problems with his sleep or appetite, denies suicidality, homicidality or having auditory or visual hallucinations.   Principal Problem: Bipolar disorder ([place]) Diagnosis:   Patient Active Problem List   Diagnosis Date Noted  . Borderline personality disorder [F60.3] 12/16/2015  . Bipolar disorder ([place]) [F31.9] 12/16/2015  . Tobacco use disorder [F17.200] 12/16/2015  . Headache, migraine [G43.909] 04/02/2015   Total Time spent with patient: 30 minutes  Past Psychiatric History:   Past Medical History:  Past Medical History:  Diagnosis Date  . Bipolar 1 disorder ([place])   . Depression   . Schizophrenia [facility])     Past Surgical History:  Procedure Laterality Date  . CESAREAN SECTION    . CHOLECYSTECTOMY     Family History:  Family History  Problem Relation Age of Onset  . Alcohol abuse Mother   . Drug abuse Mother   . Drug abuse Father   . Alcohol abuse Father   . Alcohol abuse Sister   . Drug abuse Sister    Family Psychiatric  History:  Social History:  History  Alcohol Use No     History  Drug Use No    Social History   Social History  . Marital status: Legally Separated    Spouse name: N/A  . Number of children: N/A  . Years of education: N/A   Social History Main Topics  . Smoking status: Current Every Day Smoker    Packs/day: 1.00    Types: Cigarettes    Start date: 10/12/1992  . Smokeless tobacco: Never Used  . Alcohol use No  . Drug use: No  . Sexual activity: Not Currently   Other Topics Concern  . None   Social History Narrative  . None    Additional Social History:    Pain Medications: None Reported Prescriptions: None Reported Over the Counter: None Reported History of alcohol / drug use?: No history of alcohol / drug abuse Longest period of sobriety (when/how long): Reports of no past or current use Negative Consequences of Use:  (Reports of none) Withdrawal Symptoms:  (Reports of none)                    Sleep: Good  Appetite:  Good  Current Medications: Current Facility-Administered Medications  Medication Dose Route Frequency Provider Last Rate Last Dose  . acetaminophen (TYLENOL) tablet 650 mg  650 mg Oral Q6H PRN Hildred Priest, MD      . alum & mag hydroxide-simeth (MAALOX/MYLANTA) 200-200-20 MG/5ML suspension 30 mL  30 mL Oral Q4H PRN Hildred Priest, MD      . buPROPion [facility]) tablet 75 mg  75 mg Oral QPC breakfast Hildred Priest, MD   75 mg at 12/17/15 1019  . linaclotide (LINZESS) capsule 145 mcg  145 mcg Oral QAC breakfast Hildred Priest, MD      . magnesium hydroxide (MILK OF MAGNESIA) suspension 30 mL  30 mL Oral Daily PRN Hildred Priest, MD      . nicotine (NICODERM CQ - dosed in mg/24 hours) patch 21 mg  21 mg Transdermal Daily Seth Bake  Hernandez-Gonzalez, MD   21 mg at 12/17/15 1019  . traZODone (DESYREL) tablet 100 mg  100 mg Oral QHS Hildred Priest, MD   100 mg at 12/16/15 2114  . ziprasidone (GEODON) capsule 40 mg  40 mg Oral Q supper Hildred Priest, MD   40 mg at 12/16/15 1658    Lab Results:  Results for orders placed or performed during the hospital encounter of 12/15/15 (from the past 48 hour(s))  CBC     Status: None   Collection Time: 12/16/15  1:18 PM  Result Value Ref Range   WBC 8.8 3.6 - 11.0 K/uL   RBC 4.54 3.80 - 5.20 MIL/uL   Hemoglobin 14.1 12.0 - 16.0 g/dL   HCT 40.7 35.0 - 47.0 %   MCV 89.6 80.0 - 100.0 fL   MCH 31.1 26.0 - 34.0 pg   MCHC 34.8 32.0 - 36.0 g/dL   RDW 13.9 11.5 - 14.5 %    Platelets 259 150 - 440 K/uL  Comprehensive metabolic panel     Status: Abnormal   Collection Time: 12/16/15  1:18 PM  Result Value Ref Range   Sodium 138 135 - 145 mmol/L   Potassium 4.3 3.5 - 5.1 mmol/L   Chloride 104 101 - 111 mmol/L   CO2 27 22 - 32 mmol/L   Glucose, Bld 99 65 - 99 mg/dL   BUN 12 6 - 20 mg/dL   Creatinine, Ser 0.82 0.44 - 1.00 mg/dL   Calcium 9.4 8.9 - 10.3 mg/dL   Total Protein 7.8 6.5 - 8.1 g/dL   Albumin 4.4 3.5 - 5.0 g/dL   AST 14 (L) 15 - 41 U/L   ALT 14 14 - 54 U/L   Alkaline Phosphatase 72 38 - 126 U/L   Total Bilirubin 0.4 0.3 - 1.2 mg/dL   GFR calc non Af Amer >60 >60 mL/min   GFR calc Af Amer >60 >60 mL/min    Comment: (NOTE) The eGFR has been calculated using the CKD EPI equation. This calculation has not been validated in all clinical situations. eGFR's persistently <60 mL/min signify possible Chronic Kidney Disease.    Anion gap 7 5 - 15  TSH     Status: None   Collection Time: 12/16/15  1:18 PM  Result Value Ref Range   TSH 1.594 0.350 - 4.500 uIU/mL  Vitamin B12     Status: None   Collection Time: 12/16/15  1:18 PM  Result Value Ref Range   Vitamin B-12 408 180 - 914 pg/mL    Comment: (NOTE) This assay is not validated for testing neonatal or myeloproliferative syndrome specimens for Vitamin B12 levels. Performed at [facility]   Prolactin     Status: Abnormal   Collection Time: 12/16/15  1:18 PM  Result Value Ref Range   Prolactin 27.5 (H) 4.8 - 23.3 ng/mL    Comment: (NOTE) Performed At: [facility] [place], [place] [id number] Lindon Romp MD [id number]   Hemoglobin A1c     Status: None   Collection Time: 12/16/15  1:18 PM  Result Value Ref Range   Hgb A1c MFr Bld 4.6 4.0 - 6.0 %  Lipid panel     Status: Abnormal   Collection Time: 12/16/15  1:18 PM  Result Value Ref Range   Cholesterol 170 0 - 200 mg/dL   Triglycerides 90 <150 mg/dL   HDL 41 >40 mg/dL   Total CHOL/HDL Ratio 4.1  RATIO   VLDL 18  0 - 40 mg/dL   LDL Cholesterol 111 (H) 0 - 99 mg/dL    Comment:        Total Cholesterol/HDL:CHD Risk Coronary Heart Disease Risk Table                     Men   Women  1/2 Average Risk   3.4   3.3  Average Risk       5.0   4.4  2 X Average Risk   9.6   7.1  3 X Average Risk  23.4   11.0        Use the calculated Patient Ratio above and the CHD Risk Table to determine the patient's CHD Risk.        ATP III CLASSIFICATION (LDL):  <100     mg/dL   Optimal  100-129  mg/dL   Near or Above                    Optimal  130-159  mg/dL   Borderline  160-189  mg/dL   High  >190     mg/dL   Very High   Urinalysis complete, with microscopic ([facility] only)     Status: Abnormal   Collection Time: 12/16/15  1:34 PM  Result Value Ref Range   Color, Urine RED (A) YELLOW   APPearance TURBID (A) CLEAR   Glucose, UA NEGATIVE NEGATIVE mg/dL   Bilirubin Urine NEGATIVE NEGATIVE   Ketones, ur NEGATIVE NEGATIVE mg/dL   Specific Gravity, Urine 1.009 1.005 - 1.030   Hgb urine dipstick NEGATIVE NEGATIVE   pH 7.0 5.0 - 8.0   Protein, ur NEGATIVE NEGATIVE mg/dL   Nitrite NEGATIVE NEGATIVE   Leukocytes, UA NEGATIVE NEGATIVE   RBC / HPF NONE SEEN 0 - 5 RBC/hpf   WBC, UA 0-5 0 - 5 WBC/hpf   Bacteria, UA MANY (A) NONE SEEN   Squamous Epithelial / LPF 0-5 (A) NONE SEEN  Urine Drug Screen, Qualitative ([facility] only)     Status: None   Collection Time: 12/16/15  1:34 PM  Result Value Ref Range   Tricyclic, Ur Screen NONE DETECTED NONE DETECTED   Amphetamines, Ur Screen NONE DETECTED NONE DETECTED   MDMA (Ecstasy)Ur Screen NONE DETECTED NONE DETECTED   Cocaine Metabolite,Ur [place] NONE DETECTED NONE DETECTED   Opiate, Ur Screen NONE DETECTED NONE DETECTED   Phencyclidine (PCP) Ur S NONE DETECTED NONE DETECTED   Cannabinoid 50 Ng, Ur [place] NONE DETECTED NONE DETECTED   Barbiturates, Ur Screen NONE DETECTED NONE DETECTED   Benzodiazepine, Ur Scrn NONE DETECTED NONE DETECTED   Methadone Scn, Ur NONE  DETECTED NONE DETECTED    Comment: (NOTE) 903  Tricyclics, urine               Cutoff 1000 ng/mL 200  Amphetamines, urine             Cutoff 1000 ng/mL 300  MDMA (Ecstasy), urine           Cutoff 500 ng/mL 400  Cocaine Metabolite, urine       Cutoff 300 ng/mL 500  Opiate, urine                   Cutoff 300 ng/mL 600  Phencyclidine (PCP), urine      Cutoff 25 ng/mL 700  Cannabinoid, urine              Cutoff 50 ng/mL 800  Barbiturates, urine  Cutoff 200 ng/mL 900  Benzodiazepine, urine           Cutoff 200 ng/mL 1000 Methadone, urine                Cutoff 300 ng/mL 1100 1200 The urine drug screen provides only a preliminary, unconfirmed 1300 analytical test result and should not be used for non-medical 1400 purposes. Clinical consideration and professional judgment should 1500 be applied to any positive drug screen result due to possible 1600 interfering substances. A more specific alternate chemical method 1700 must be used in order to obtain a confirmed analytical result.  1800 Gas chromato graphy / mass spectrometry (GC/MS) is the preferred 1900 confirmatory method.   Pregnancy, urine     Status: None   Collection Time: 12/16/15  1:34 PM  Result Value Ref Range   Preg Test, Ur NEGATIVE NEGATIVE    Blood Alcohol level:  No results found for: [facility]  Metabolic Disorder Labs: Lab Results  Component Value Date   HGBA1C 4.6 12/16/2015   Lab Results  Component Value Date   PROLACTIN 27.5 (H) 12/16/2015   Lab Results  Component Value Date   CHOL 170 12/16/2015   TRIG 90 12/16/2015   HDL 41 12/16/2015   CHOLHDL 4.1 12/16/2015   VLDL 18 12/16/2015   LDLCALC 111 (H) 12/16/2015    Physical Findings: AIMS: Facial and Oral Movements Muscles of Facial Expression: None, normal Lips and Perioral Area: None, normal Jaw: None, normal Tongue: None, normal,Extremity Movements Upper (arms, wrists, hands, fingers): None, normal Lower (legs, knees, ankles, toes): None,  normal, Trunk Movements Neck, shoulders, hips: None, normal, Overall Severity Severity of abnormal movements (highest score from questions above): None, normal Incapacitation due to abnormal movements: None, normal Patient's awareness of abnormal movements (rate only patient's report): No Awareness, Dental Status Current problems with teeth and/or dentures?: No Does patient usually wear dentures?: No  CIWA:    COWS:     Musculoskeletal: Strength & Muscle Tone: within normal limits Gait & Station: normal Patient leans: N/A  Psychiatric Specialty Exam: Physical Exam  Constitutional: She is oriented to person, place, and time. She appears well-developed and well-nourished.  HENT:  Head: Normocephalic and atraumatic.  Eyes: EOM are normal.  Neck: Normal range of motion.  Respiratory: Effort normal.  Musculoskeletal: Normal range of motion.  Neurological: She is alert and oriented to person, place, and time.    Review of Systems  Constitutional: Negative.   HENT: Negative.   Eyes: Negative.   Respiratory: Negative.   Cardiovascular: Negative.   Gastrointestinal: Negative.   Genitourinary: Negative.   Musculoskeletal: Negative.   Skin: Negative.   Neurological: Negative.   Endo/Heme/Allergies: Negative.   Psychiatric/Behavioral: Positive for depression. Negative for hallucinations, memory loss, substance abuse and suicidal ideas. The patient is not nervous/anxious and does not have insomnia.     Blood pressure 112/70, pulse 90, temperature 98.1 F (36.7 C), temperature source Oral, resp. rate 18, height 5' 1"  (1.549 m), weight 88 kg (194 lb), SpO2 99 %.Body mass index is 36.66 kg/m.  General Appearance: Fairly Groomed  Eye Contact:  Fair  Speech:  Clear and Coherent  Volume:  Normal  Mood:  Irritable  Affect:  Constricted  Thought Process:  Linear and Descriptions of Associations: Intact  Orientation:  Full (Time, Place, and Person)  Thought Content:  Hallucinations: None   Suicidal Thoughts:  No  Homicidal Thoughts:  No  Memory:  Immediate;   Fair Recent;   Fair Remote;  Fair  Judgement:  Fair  Insight:  Fair  Psychomotor Activity:  Decreased  Concentration:  Concentration: Fair and Attention Span: Fair  Recall:  [employer] of Knowledge:  Good  Language:  Good  Akathisia:  No  Handed:    AIMS (if indicated):     Assets:  Financial Resources/Insurance Housing Physical Health Social Support  ADL's:  Intact  Cognition:  WNL  Sleep:  Number of Hours: 8.45     Treatment Plan Summary:  Bipolar d/o: continue pt on wellbutrin 75 mg po q day. Pt request to have depakote d/c as she feels this medication is worsening her mood.  Instead she has been started on geodon 40 mg with dinner to target anger, irritability. Haldol will be d/c  Hyperprolactinemia and past history of galactorrhea: I we'll start the patient on amantadine 100 mg by mouth daily at bedtime  Insomnia: continue trazodone   Borderline personality d/o: pt will be encourage to continue individual therapy upon discharge.  Pt has been assigned to group psychotherapy while in the hospital.  Tobacco use disorder: pt has been started on nicotine patch of 21 mg q day  Constipation: continue linzess po q day  Dispo: back home with family once stable in next 24-48h  F/u will continue to f/u with [place] and [facility] for therapy  Labs: hbA1c, lipid panel, prolactin, TSH, b12, Utox, UA, urine pregnancy. All labs within the normal limits with the exception of prolactin which is elevated at 24.    Hildred Priest, MD 12/17/2015, 11:13 AM

## 2015-12-17 NOTE — Discharge Summary (Signed)
Physician Discharge Summary Note  Patient:  Taylor Fleming is an 35 y.o., female MRN:  [id number] DOB:  10/31/1980 Patient phone:  [phone] (home)  Patient address:   [address] [place] [address],  Total Time spent with patient: 30 minutes  Date of Admission:  12/15/2015 Date of Discharge: 12/18/15  Reason for Admission:  SI  Principal Problem: Bipolar disorder (HCC) Discharge Diagnoses: Patient Active Problem List   Diagnosis Date Noted  . Borderline personality disorder [F60.3] 12/16/2015  . Bipolar disorder (HCC) [F31.9] 12/16/2015  . Tobacco use disorder [F17.200] 12/16/2015  . Headache, migraine [G43.909] 04/02/2015   History of Present Illness:   The patient is a 35-year-old separated Caucasian female from [place]. Patient was referred for psychiatric admission by her outpatient psychiatrist Dr. Faheem.  Patient  was seeing yesterday at the psychiatric outpatient clinic. She reported that she has been feeling very depressed and has been having negative thoughts including thoughts to hurt herself. She reported that she is getting worse. Patient reported that she is unable to control herself and she wakes up every morning with the thought to hurt herself. She is having relationship issues with her family members and she is unable to contract for safety at this time.   Today during assessment the patient was uncooperative. She says she was angry and did not feel like answering many of the questions. She was not willing to discuss in detail her past psychiatric history neither the current stressors that are causing the worsening in her depression.  Patient says her main goal is to have the Depakote discontinued as she blames it for the worsening of mood. The patient says that she has been unable to abstain from cutting herself for 10 years but just recently started cutting again. Patient says that Depakote was recently increased from 500 mg 2 at  thousand milligrams and so after that to place her mood worsen.  Patient denies today having suicidality homicidality or having auditory or visual hallucinations. She also denies having any urge or psychotic. She however reported feeling very angry and she greatly her depression as an 8 out of 10 x 10 being the worst. She says she has been is sleeping excessively at home. She says that she has been having to force herself to eat and feels that she has no energy and no ability to concentrate on things.  As far as substance abuse patient states that she had issues with alcohol in the past but recently she is only drinking once or twice a month and is usually only 1 drink. The patient reports smoking marijuana in the past. Currently she is not using any illicit substances. She smokes heavily about 1-1/2 packs of cigarettes per day  Trauma history patient was uncooperative during assessment so we didn't discuss issues with trauma. However later on the patient mentioned that her mother committed suicide when the patient was 35 years old.    Associated Signs/Symptoms: Depression Symptoms:  depressed mood, hypersomnia, fatigue, hopelessness, recurrent thoughts of death, (Hypo) Manic Symptoms:  Irritable Mood, Anxiety Symptoms:  Excessive Worry, Psychotic Symptoms:  Hallucinations: None PTSD Symptoms: NA Total Time spent with patient: 1 hour  Past Psychiatric History: multiple psychiatric hospitalizations before age 17. Reports having many suicidal attempts asa child. H/o self injury but was able not to hurt herself for 10 years.  Just started seeing [facility] 2 months ago.  Sees a therapist at [facility].  Takes wellbutrin 75 mg q am, depakote   500 mg qhs, hadol 2 mg qhs and trazodone 150 mg   Family Psychiatric  History: pt's mother committed suicide when pt was 24 y/o.  Says she has multiple relatives with bipolar and schizophrenia  Tobacco Screening: smokes 1 and  1/2 packs per day  Social History: Pt is separated, has a 12 y/o son. Receives disability since childhood for mental health issues.  Lives with multiple relatives and her son here in [place].  Denies legal history. Education: 2 years of technical college   Past Medical History:  Past Medical History:  Diagnosis Date  . Bipolar 1 disorder (HCC)   . Depression   . Schizophrenia (HCC)     Past Surgical History:  Procedure Laterality Date  . CESAREAN SECTION    . CHOLECYSTECTOMY     Family History:  Family History  Problem Relation Age of Onset  . Alcohol abuse Mother   . Drug abuse Mother   . Drug abuse Father   . Alcohol abuse Father   . Alcohol abuse Sister   . Drug abuse Sister     Social History:  History  Alcohol Use No     History  Drug Use No    Social History   Social History  . Marital status: Legally Separated    Spouse name: N/A  . Number of children: N/A  . Years of education: N/A   Social History Main Topics  . Smoking status: Current Every Day Smoker    Packs/day: 1.00    Types: Cigarettes    Start date: 10/12/1992  . Smokeless tobacco: Never Used  . Alcohol use No  . Drug use: No  . Sexual activity: Not Currently   Other Topics Concern  . None   Social History Narrative  . None    Hospital Course:    Bipolar d/o: continue pt on wellbutrin 75 mg po q day. Pt request to have depakote d/c as she feels this medication is worsening her mood. Instead she has been started on geodon 40 mg with dinner to target anger, irritability. Haldol will be d/c  Hyperprolactinemia and past history of galactorrhea: I will start the patient on amantadine 100 mg by mouth daily at bedtime  Insomnia: continue trazodone   Borderline personality d/o: pt will be encourage to continue individual therapy upon discharge.   Tobacco use disorder: pt has been given nicotine patch of 21 mg q day  Constipation: continue linzess po q day  Dispo: back home  with family   F/u will continue to f/u with [facility] and [facility] for therapy  Labs: hbA1c, lipid panel, prolactin, TSH, b12, Utox, UA, urine pregnancy. All labs within the normal limits with the exception of prolactin which is elevated at 24.  This was a brief hospitalization. During her stay the patient did not display any unsafe or destructive behaviors. She participated in group. She was cooperative with the nurses and that the rest of the staff. She complied with medications. She did not have any adverse reactions to her medications. On discharge she was not reporting any side effects or any physical complaints.  Today she denies suicidality, homicidality, hallucinations or urges of cutting. She reports significant improvement of mood and grades her depression as a 2 out of 10 x 10 being the worst. She also describes low levels of anxiety.  Patient denies having hopelessness or helplessness. She appears motivated for treatment and future oriented. Patient has supportive family.     Physical Findings:   AIMS: Facial and Oral Movements Muscles of Facial Expression: None, normal Lips and Perioral Area: None, normal Jaw: None, normal Tongue: None, normal,Extremity Movements Upper (arms, wrists, hands, fingers): None, normal Lower (legs, knees, ankles, toes): None, normal, Trunk Movements Neck, shoulders, hips: None, normal, Overall Severity Severity of abnormal movements (highest score from questions above): None, normal Incapacitation due to abnormal movements: None, normal Patient's awareness of abnormal movements (rate only patient's report): No Awareness, Dental Status Current problems with teeth and/or dentures?: No Does patient usually wear dentures?: No  CIWA:    COWS:     Musculoskeletal: Strength & Muscle Tone: within normal limits Gait & Station: normal Patient leans: N/A  Psychiatric Specialty Exam: Physical Exam  Constitutional: She is  oriented to person, place, and time. She appears well-developed and well-nourished.  HENT:  Head: Normocephalic and atraumatic.  Eyes: Conjunctivae and EOM are normal.  Neck: Normal range of motion.  Respiratory: Effort normal.  Musculoskeletal: Normal range of motion.  Neurological: She is alert and oriented to person, place, and time.    Review of Systems  Constitutional: Negative.   HENT: Negative.   Eyes: Negative.   Respiratory: Negative.   Cardiovascular: Negative.   Skin: Negative.   Neurological: Negative.   Endo/Heme/Allergies: Negative.   Psychiatric/Behavioral: Negative.     Blood pressure (!) 99/57, pulse 86, temperature 98 F (36.7 C), temperature source Oral, resp. rate 18, height 5' 1" (1.549 m), weight 88 kg (194 lb), SpO2 99 %.Body mass index is 36.66 kg/m.  General Appearance: Fairly Groomed  Eye Contact:  Fair  Speech:  Clear and Coherent  Volume:  Normal  Mood:  Dysphoric  Affect:  Congruent  Thought Process:  Linear and Descriptions of Associations: Intact  Orientation:  Full (Time, Place, and Person)  Thought Content:  Hallucinations: None  Suicidal Thoughts:  No  Homicidal Thoughts:  No  Memory:  Immediate;   Good Recent;   Good Remote;   Good  Judgement:  Fair  Insight:  Fair  Psychomotor Activity:  Decreased  Concentration:  Concentration: Fair and Attention Span: Fair  Recall:  Good  Fund of Knowledge:  Good  Language:  Good  Akathisia:  No  Handed:    AIMS (if indicated):     Assets:  Communication Skills Physical Health Social Support  ADL's:  Intact  Cognition:  WNL  Sleep:  Number of Hours: 7.5     Have you used any form of tobacco in the last 30 days? (Cigarettes, Smokeless Tobacco, Cigars, and/or Pipes): Yes  Has this patient used any form of tobacco in the last 30 days? (Cigarettes, Smokeless Tobacco, Cigars, and/or Pipes) Yes, Yes, A prescription for an FDA-approved tobacco cessation medication was offered at discharge and the  patient refused  Blood Alcohol level:  No results found for: [facility]  Metabolic Disorder Labs:  Lab Results  Component Value Date   HGBA1C 4.6 12/16/2015   Lab Results  Component Value Date   PROLACTIN 27.5 (H) 12/16/2015   Lab Results  Component Value Date   CHOL 170 12/16/2015   TRIG 90 12/16/2015   HDL 41 12/16/2015   CHOLHDL 4.1 12/16/2015   VLDL 18 12/16/2015   LDLCALC 111 (H) 12/16/2015   Results for EMREE, LOCICERO (MRN [id number]) as of 12/17/2015 13:31  Ref. Range 12/16/2015 13:18 12/16/2015 13:34  Sodium Latest Ref Range: 135 - 145 mmol/L 138   Potassium Latest Ref Range: 3.5 - 5.1 mmol/L 4.3   Chloride Latest Ref Range: 101 -  111 mmol/L 104   CO2 Latest Ref Range: 22 - 32 mmol/L 27   BUN Latest Ref Range: 6 - 20 mg/dL 12   Creatinine Latest Ref Range: 0.44 - 1.00 mg/dL 0.82   Calcium Latest Ref Range: 8.9 - 10.3 mg/dL 9.4   EGFR (Non-African Amer.) Latest Ref Range: >60 mL/min >60   EGFR (African American) Latest Ref Range: >60 mL/min >60   Glucose Latest Ref Range: 65 - 99 mg/dL 99   Anion gap Latest Ref Range: 5 - 15  7   Alkaline Phosphatase Latest Ref Range: 38 - 126 U/L 72   Albumin Latest Ref Range: 3.5 - 5.0 g/dL 4.4   AST Latest Ref Range: 15 - 41 U/L 14 (L)   ALT Latest Ref Range: 14 - 54 U/L 14   Total Protein Latest Ref Range: 6.5 - 8.1 g/dL 7.8   Total Bilirubin Latest Ref Range: 0.3 - 1.2 mg/dL 0.4   Cholesterol Latest Ref Range: 0 - 200 mg/dL 170   Triglycerides Latest Ref Range: <150 mg/dL 90   HDL Cholesterol Latest Ref Range: >40 mg/dL 41   LDL (calc) Latest Ref Range: 0 - 99 mg/dL 111 (H)   VLDL Latest Ref Range: 0 - 40 mg/dL 18   Total CHOL/HDL Ratio Latest Units: RATIO 4.1   Vitamin B12 Latest Ref Range: 180 - 914 pg/mL 408   WBC Latest Ref Range: 3.6 - 11.0 K/uL 8.8   RBC Latest Ref Range: 3.80 - 5.20 MIL/uL 4.54   Hemoglobin Latest Ref Range: 12.0 - 16.0 g/dL 14.1   HCT Latest Ref Range: 35.0 - 47.0 % 40.7   MCV Latest Ref Range: 80.0 -  100.0 fL 89.6   MCH Latest Ref Range: 26.0 - 34.0 pg 31.1   MCHC Latest Ref Range: 32.0 - 36.0 g/dL 34.8   RDW Latest Ref Range: 11.5 - 14.5 % 13.9   Platelets Latest Ref Range: 150 - 440 K/uL 259   Prolactin Latest Ref Range: 4.8 - 23.3 ng/mL 27.5 (H)   Hemoglobin A1C Latest Ref Range: 4.0 - 6.0 % 4.6   Preg Test, Ur Latest Ref Range: NEGATIVE   NEGATIVE  TSH Latest Ref Range: 0.350 - 4.500 uIU/mL 1.594   Appearance Latest Ref Range: CLEAR   TURBID (A)  Bacteria, UA Latest Ref Range: NONE SEEN   MANY (A)  Bilirubin Urine Latest Ref Range: NEGATIVE   NEGATIVE  Color, Urine Latest Ref Range: YELLOW   RED (A)  Glucose Latest Ref Range: NEGATIVE mg/dL  NEGATIVE  Hgb urine dipstick Latest Ref Range: NEGATIVE   NEGATIVE  Ketones, ur Latest Ref Range: NEGATIVE mg/dL  NEGATIVE  Leukocytes, UA Latest Ref Range: NEGATIVE   NEGATIVE  Nitrite Latest Ref Range: NEGATIVE   NEGATIVE  pH Latest Ref Range: 5.0 - 8.0   7.0  Protein Latest Ref Range: NEGATIVE mg/dL  NEGATIVE  RBC / HPF Latest Ref Range: 0 - 5 RBC/hpf  NONE SEEN  Specific Gravity, Urine Latest Ref Range: 1.005 - 1.030   1.009  Squamous Epithelial / LPF Latest Ref Range: NONE SEEN   0-5 (A)  WBC, UA Latest Ref Range: 0 - 5 WBC/hpf  0-5  Amphetamines, Ur Screen Latest Ref Range: NONE DETECTED   NONE DETECTED  Barbiturates, Ur Screen Latest Ref Range: NONE DETECTED   NONE DETECTED  Benzodiazepine, Ur Scrn Latest Ref Range: NONE DETECTED   NONE DETECTED  Cocaine Metabolite,Ur  Latest Ref Range: NONE DETECTED   NONE   DETECTED  Methadone Scn, Ur Latest Ref Range: NONE DETECTED   NONE DETECTED  MDMA (Ecstasy)Ur Screen Latest Ref Range: NONE DETECTED   NONE DETECTED  Cannabinoid 50 Ng, Ur [place] Latest Ref Range: NONE DETECTED   NONE DETECTED  Opiate, Ur Screen Latest Ref Range: NONE DETECTED   NONE DETECTED  Phencyclidine (PCP) Ur S Latest Ref Range: NONE DETECTED   NONE DETECTED  Tricyclic, Ur Screen Latest Ref Range: NONE DETECTED   NONE  DETECTED   See Psychiatric Specialty Exam and Suicide Risk Assessment completed by Attending Physician prior to discharge.  Discharge destination:  Home  Is patient on multiple antipsychotic therapies at discharge:  No   Has Patient had three or more failed trials of antipsychotic monotherapy by history:  No  Recommended Plan for Multiple Antipsychotic Therapies: NA     Medication List    STOP taking these medications   divalproex 500 MG 24 hr tablet Commonly known as:  DEPAKOTE ER   haloperidol 2 MG tablet Commonly known as:  HALDOL     TAKE these medications     Indication  amantadine 100 MG capsule Commonly known as:  SYMMETREL Take 1 capsule (100 mg total) by mouth at bedtime.  Indication:  high prolactin   buPROPion 75 MG tablet Commonly known as:  WELLBUTRIN Take 1 tablet (75 mg total) by mouth daily after breakfast.  Indication:  Major Depressive Disorder   LINZESS 145 MCG Caps capsule Generic drug:  linaclotide Take by mouth.  Indication:  Chronic Constipation of Unknown Cause   traZODone 100 MG tablet Commonly known as:  DESYREL Take 1 tablet (100 mg total) by mouth at bedtime.  Indication:  Trouble Sleeping   ziprasidone 40 MG capsule Commonly known as:  GEODON Take 1 capsule (40 mg total) by mouth daily with supper.  Indication:  Manic-Depression      Follow-up Information    [facility]. Go on 12/21/2015.   Why:  Please arrive to your psychiatrist appointment for hospital follow-up at [facility] at 1:15pm. It is important that you bring your discharge paperworkwith you to your appointment.  Contact information: [address], [place], [place] [address] Hours: 8:30AM-5PM Phone: [phone] Fax: [phone]           Signed: Hernandez-Gonzalez,  , MD 12/18/2015, 1:25 PM 

## 2015-12-17 NOTE — [facility] Group Notes (Signed)
[facility] Group Notes:  (Nursing/MHT/Case Management/Adjunct)  Date:  12/17/2015  Time:  3:31 AM  Type of Therapy:  Group Therapy  Participation Level:  Active  Participation Quality:  Appropriate  Affect:  Appropriate  Cognitive:  Appropriate  Insight:  Appropriate  Engagement in Group:  Engaged  Modes of Intervention:  n/a  Summary of Progress/Problems:  Taylor Fleming 12/17/2015, 3:31 AM

## 2015-12-18 DIAGNOSIS — F319 Bipolar disorder, unspecified: Secondary | ICD-10-CM | POA: Diagnosis not present

## 2015-12-18 NOTE — Tx Team (Signed)
Interdisciplinary Treatment Plan Update (Adult)         Date: 12/18/2015   Time Reviewed: 10:30 AM   Progress in Treatment: Improving Attending groups: Yes  Participating in groups: Yes  Taking medication as prescribed: Yes  Tolerating medication: Yes  Family/Significant other contact made: Pt refused family contact Patient understands diagnosis: Yes  Discussing patient identified problems/goals with staff: Yes  Medical problems stabilized or resolved: Yes  Denies suicidal/homicidal ideation: Yes  Issues/concerns per patient self-inventory: Yes  Other:   New problem(s) identified: N/A   Discharge Plan or Barriers: see below   Reason for Continuation of Hospitalization:   Depression   Anxiety   Medication Stabilization   Comments: N/A   Discharge date: 12/18/15    Patient is a 35 year old female admitted for depression and suicidal thoughts. Patient lives in [place], [place].  She is having relationship issues with her family members and she is unable to contract for safety at this time.  Her husband was also present during the interview reported that he is concerned about her behavior as well. Patient reported that she is compliant with the medications but they are not helping her. Her 53 year old son just returned from [place] and she was concerned about him as he had a good time with the grandfather. The family is planning to go to [place] next weekend.  Patient will benefit from crisis stabilization, medication evaluation, group therapy, and psycho education in addition to case management for discharge planning. Patient and CSW reviewed pt's identified goals and treatment plan. Pt verbalized understanding and agreed to treatment plan.    Review of initial/current patient goals per problem list:  1. Goal(s): Patient will participate in aftercare plan   Met: Yes  Target date: 3-5 days post admission date   As evidenced by: Patient will participate within aftercare  plan AEB aftercare provider and housing plan at discharge being identified.   Patient will follow-up with [facility]   2. Goal (s): Patient will exhibit decreased depressive symptoms and suicidal ideations.   Met: Yes  Target date: 3-5 days post admission date   As evidenced by: Patient will utilize self-rating of depression at 3 or below and demonstrate decreased signs of depression or be deemed stable for discharge by MD.   Patient reports a depression of a 2 at this time. Adequate for discharge per MD.    3. Goal(s): Patient will demonstrate decreased signs and symptoms of anxiety.   Met: Yes  Target date: 3-5 days post admission date   As evidenced by: Patient will utilize self-rating of anxiety at 3 or below and demonstrated decreased signs of anxiety, or be deemed stable for discharge by MD   Patient reports an anxiety score of 2 at this time. Adequate for discharge per MD.    Attendees:  Patient:  Family:  Physician: Dr. Merlyn Albert , MD     12/18/2015 10:30 AM  Nursing: Floyde Parkins, RN       12/18/2015 10:30 AM  Clinical Social Worker: Emilie Rutter, LCSWA  12/18/2015 10:30AM

## 2015-12-18 NOTE — Plan of Care (Signed)
Problem: Medication: Goal: Compliance with prescribed medication regimen will improve Outcome: Progressing Pt compliant with medication regimen.   

## 2015-12-18 NOTE — Progress Notes (Signed)
Pleasant and cooperative.  Excited about going home today.  Denies SI/HI/AVH.   Discharge instructions given, verbalized understanding.  Prescriptions given and personal belongings returned.  Escorted off unit by this Clinical research associate to main entrance to meet family to travel home.

## 2015-12-18 NOTE — Progress Notes (Signed)
D: Observed pt in room. Patient alert and oriented x4. Patient denies SI/HI/AVH. Pt affect is flat. Pt isolated most of evening, only coming out for snack, and forwarded little. Pt said her day was "good" and indicated that she went to one group. Pt stated "only think I want is a cigarette." Pt had no other complaints or concerns.  A: Offered active listening and support. Provided therapeutic communication. Administered scheduled medications. Encouraged pt to spend more time being active to help take focus away from cigarette craving.  R: Pt pleasant and cooperative. Pt medication compliant. Will continue Q15 min. checks. Safety maintained.

## 2015-12-21 ENCOUNTER — Encounter: Payer: Self-pay | Admitting: Psychiatry

## 2015-12-21 ENCOUNTER — Ambulatory Visit (INDEPENDENT_AMBULATORY_CARE_PROVIDER_SITE_OTHER): Payer: 59 | Admitting: Psychiatry

## 2015-12-21 VITALS — BP 123/83 | HR 86 | Temp 98.5°F | Ht 61.0 in | Wt 191.4 lb

## 2015-12-21 DIAGNOSIS — F250 Schizoaffective disorder, bipolar type: Secondary | ICD-10-CM | POA: Diagnosis not present

## 2015-12-21 DIAGNOSIS — F411 Generalized anxiety disorder: Secondary | ICD-10-CM | POA: Diagnosis not present

## 2015-12-21 MED ORDER — BUPROPION HCL 75 MG PO TABS: 75.0000 mg | ORAL_TABLET | Freq: Every day | ORAL | 0 refills | Status: DC

## 2015-12-21 MED ORDER — TRAZODONE HCL 100 MG PO TABS: 100.0000 mg | ORAL_TABLET | Freq: Every day | ORAL | 0 refills | Status: DC

## 2015-12-21 MED ORDER — AMANTADINE HCL 100 MG PO CAPS: 100.0000 mg | ORAL_CAPSULE | Freq: Every day | ORAL | 0 refills | Status: DC

## 2015-12-21 MED ORDER — ZIPRASIDONE HCL 40 MG PO CAPS: 40.0000 mg | ORAL_CAPSULE | Freq: Every day | ORAL | 0 refills | Status: DC

## 2015-12-21 NOTE — Progress Notes (Signed)
Psychiatric MD Progress Note   Patient Identification: Taylor Fleming MRN:  [id number] Date of Evaluation:  12/21/2015 Referral Source: [facility]  Chief Complaint:   Chief Complaint    Follow-up; Medication Refill     Visit Diagnosis:    ICD-9-CM ICD-10-CM   1. Schizoaffective disorder, bipolar type (HCC) 295.70 F25.0   2. GAD (generalized anxiety disorder) 300.02 F41.1     History of Present Illness:    Patient is a 35 year old female who was seen after her recent discharge from the inpatient behavioral health unit. She reported that she is currently feeling better after her medication have been adjusted. She reported that she is sleeping well. She likes the amantadine which was admitted due to her elevated prolactin level. She feels much calm and alert. She currently denied having any suicidal ideations or plans. She is compliant with her medications. She reported that she was discharged on Friday. She is not exhibiting any suicidal homicidal ideations or plans. She appeared calm and collected during the interview. We discussed about her labs in detail and she demonstrated understanding.      Associated Signs/Symptoms: Depression Symptoms:  depressed mood, fatigue, anxiety, loss of energy/fatigue, (Hypo) Manic Symptoms:  Distractibility, Flight of Ideas, Impulsivity, Irritable Mood, Labiality of Mood, Anxiety Symptoms:  Excessive Worry, Psychotic Symptoms:  Hallucinations: Auditory floating PTSD Symptoms: Negative NA  Past Psychiatric History:  She described her by psychiatric history as follows Age 47- 52 - 7 times, suicidal attemts, cutting wrists, OD, running in front of vehicle Age17 -  Suicide attempt  Age 55- suicidal thoughts Age 66- 4- twice just going to ER Does not know the names of hospitals. Lived in [place], [place], [place] and here Was in [facility] and [facility]   Previous Psychotropic Medications: Latuda Lithium- h/o Li  shock Seroquel lamictal-  shingles   Substance Abuse History in the last 12 months:  No.  Consequences of Substance Abuse: Negative NA  Past Medical History:  Past Medical History:  Diagnosis Date  . Bipolar 1 disorder (HCC)   . Depression   . Schizophrenia [facility])     Past Surgical History:  Procedure Laterality Date  . CESAREAN SECTION    . CHOLECYSTECTOMY      Family Psychiatric History:  Kateri Mc is schizophrenic and aunt bipolar   Family History:  Family History  Problem Relation Age of Onset  . Alcohol abuse Mother   . Drug abuse Mother   . Drug abuse Father   . Alcohol abuse Father   . Alcohol abuse Sister   . Drug abuse Sister     Social History:   Social History   Social History  . Marital status: Legally Separated    Spouse name: N/A  . Number of children: N/A  . Years of education: N/A   Social History Main Topics  . Smoking status: Current Every Day Smoker    Packs/day: 1.00    Types: Cigarettes    Start date: 10/12/1992  . Smokeless tobacco: Never Used  . Alcohol use No  . Drug use: No  . Sexual activity: Not Currently   Other Topics Concern  . None   Social History Narrative  . None    Additional Social History:   Lives  With aunt, uncle, son  and cousin Married x 2  Legally married- seperated now.   Allergies:   Allergies  Allergen Reactions  . Penicillin G Swelling    Blisters in throat  . Penicillins Swelling  . Ibuprofen  Rash    Metabolic Disorder Labs: Lab Results  Component Value Date   HGBA1C 4.6 12/16/2015   Lab Results  Component Value Date   PROLACTIN 27.5 (H) 12/16/2015   Lab Results  Component Value Date   CHOL 170 12/16/2015   TRIG 90 12/16/2015   HDL 41 12/16/2015   CHOLHDL 4.1 12/16/2015   VLDL 18 12/16/2015   LDLCALC 111 (H) 12/16/2015     Current Medications: Current Outpatient Prescriptions  Medication Sig Dispense Refill  . amantadine (SYMMETREL) 100 MG capsule Take 1 capsule (100 mg total) by mouth at bedtime. 30  capsule 0  . buPROPion (WELLBUTRIN) 75 MG tablet Take 1 tablet (75 mg total) by mouth daily after breakfast. 30 tablet 0  . linaclotide (LINZESS) 145 MCG CAPS capsule Take by mouth.    . traZODone (DESYREL) 100 MG tablet Take 1 tablet (100 mg total) by mouth at bedtime. 30 tablet 0  . ziprasidone (GEODON) 40 MG capsule Take 1 capsule (40 mg total) by mouth daily with supper. 30 capsule 0   No current facility-administered medications for this visit.     Neurologic: Headache: No Seizure: No Paresthesias:No  Musculoskeletal: Strength & Muscle Tone: within normal limits Gait & Station: normal Patient leans: N/A  Psychiatric Specialty Exam: ROS  Blood pressure 123/83, pulse 86, temperature 98.5 F (36.9 C), temperature source Oral, height  (1.549 m), weight 191 lb 6.4 oz (86.8 kg).Body mass index is 36.16 kg/m.  General Appearance: Casual  Eye Contact:  Fair  Speech:  Slow  Volume:  Normal  Mood:  Anxious  Affect:  Congruent  Thought Process:  Coherent  Orientation:  Full (Time, Place, and Person)  Thought Content:  WDL  Suicidal Thoughts:  No  Homicidal Thoughts:  No  Memory:  Immediate;   Fair  Judgement:  Impaired  Insight:  Shallow  Psychomotor Activity:  Normal  Concentration:  Concentration: Fair and Attention Span: Fair  Recall:  [employer] of Knowledge:Fair  Language: Fair  Akathisia:  No  Handed:  Right  AIMS (if indicated):    Assets:  Communication Skills Desire for Improvement Physical Health Social Support Transportation  ADL's:  Intact  Cognition: WNL  Sleep:      Treatment Plan Summary: Medication management  Continue medications as prescribed. She will be followed up in a month or earlier depending on her symptoms. She is stable on her current medications.    More than 50% of the time spent in psychoeducation, counseling and coordination of care.    This note was generated in part or whole with voice recognition software. Voice  regonition is usually quite accurate but there are transcription errors that can and very often do occur. I apologize for any typographical errors that were not detected and corrected.    Brandy Hale, MD 7/31/20173:41 PM

## 2016-01-18 IMAGING — US US BREAST LTD UNI LEFT INC AXILLA
1 series · 6 of 6 positions shown · non-contrast
Comparison: None available

CLINICAL DATA: Bilateral nipple discharge and pain. Evaluate for
possible abscess. Patient describes changes in the left nipple
associated with tenderness

EXAM:
ULTRASOUND OF THE BILATERAL BREAST

[Series 1: us breast ltd uni left inc axilla · 0.08mm/px · 6 of 6 slices shown]
[im 1/6]
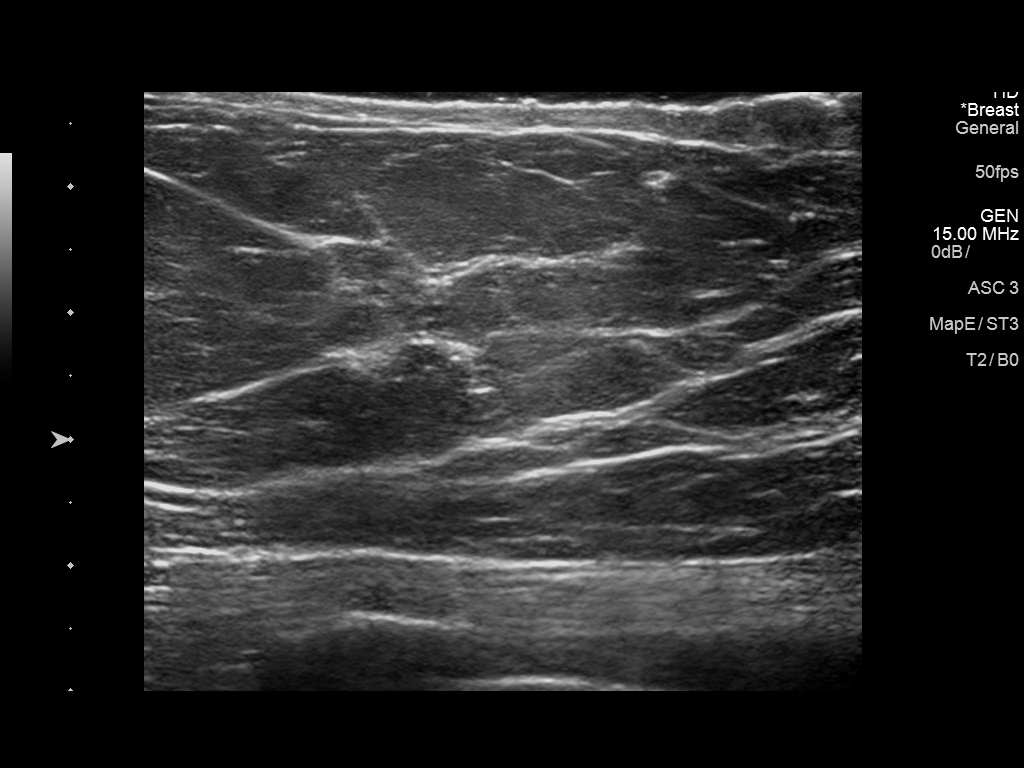
[im 2/6]
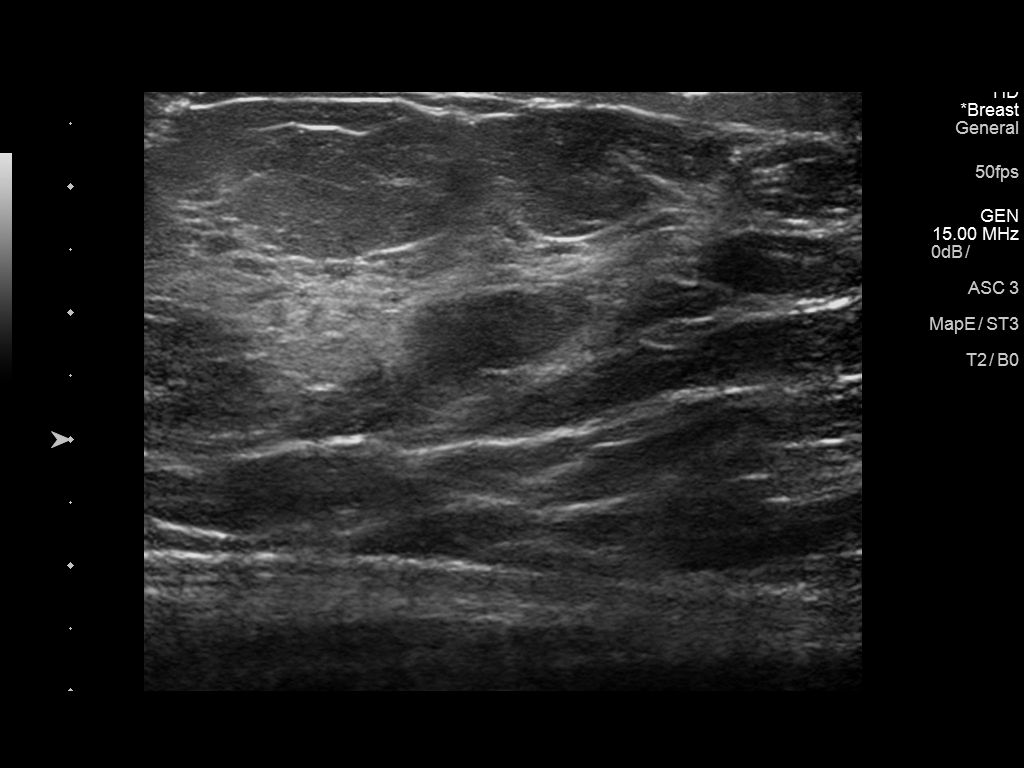
[im 3/6]
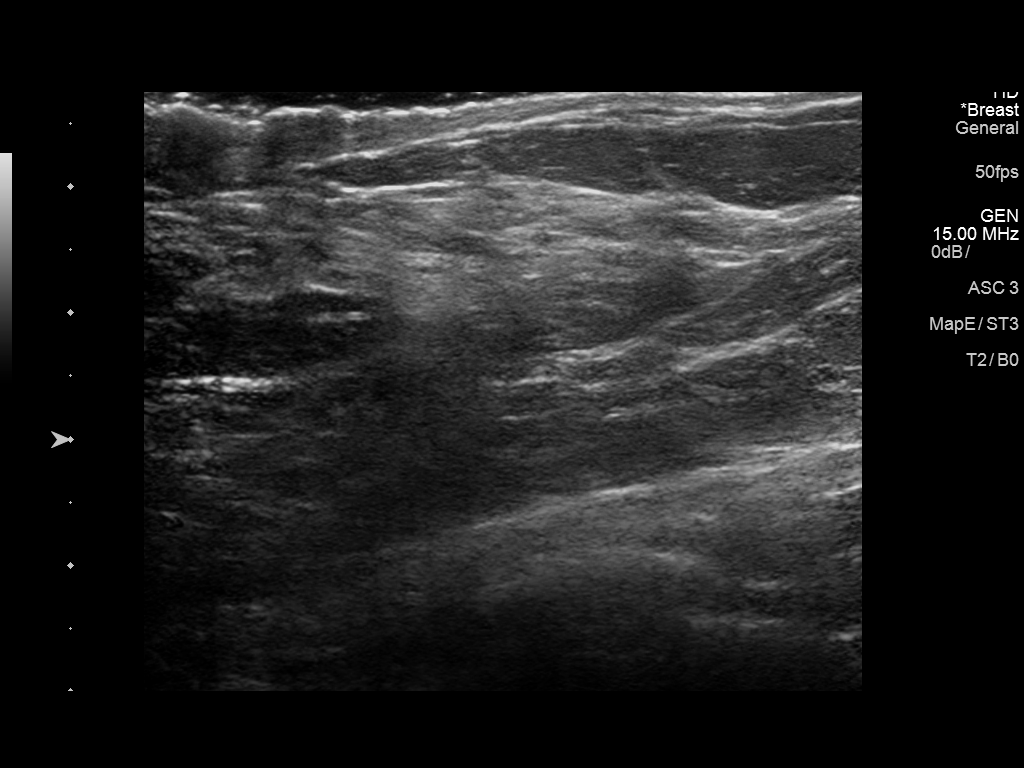
[im 4/6]
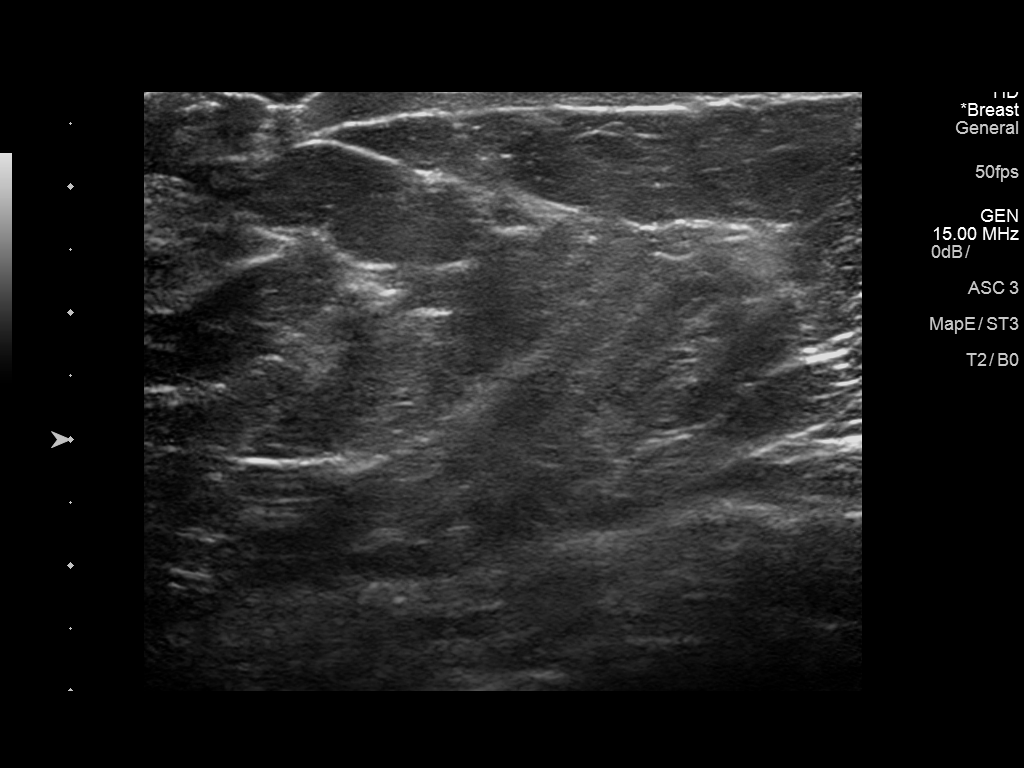
[im 5/6]
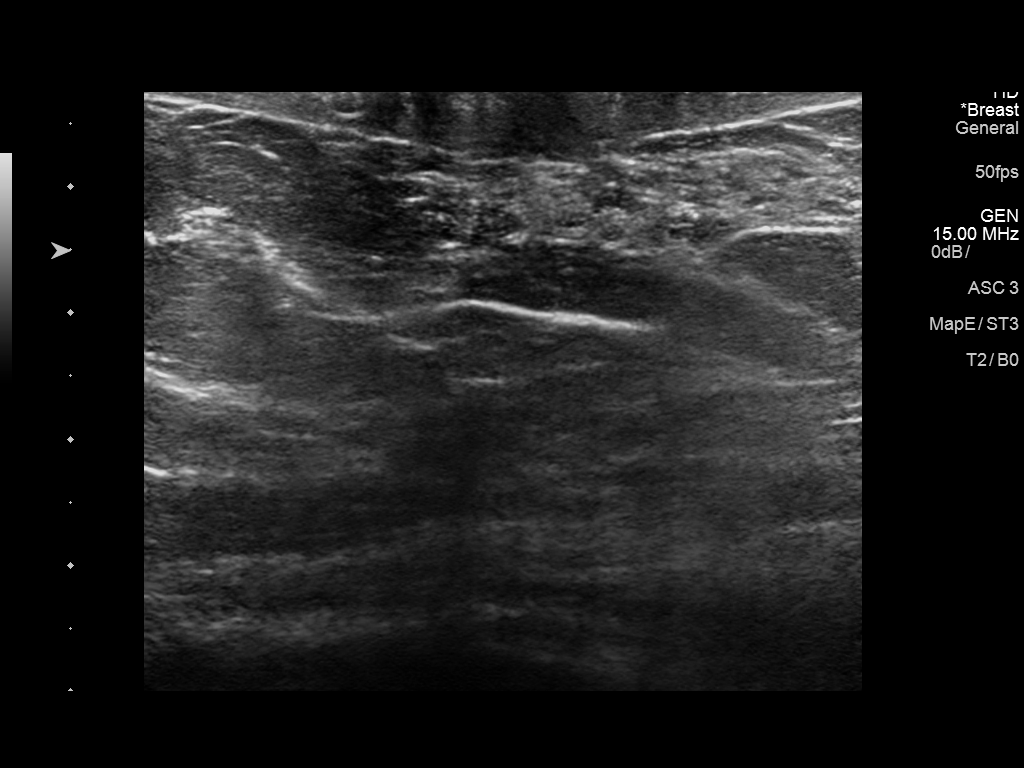
[im 6/6]
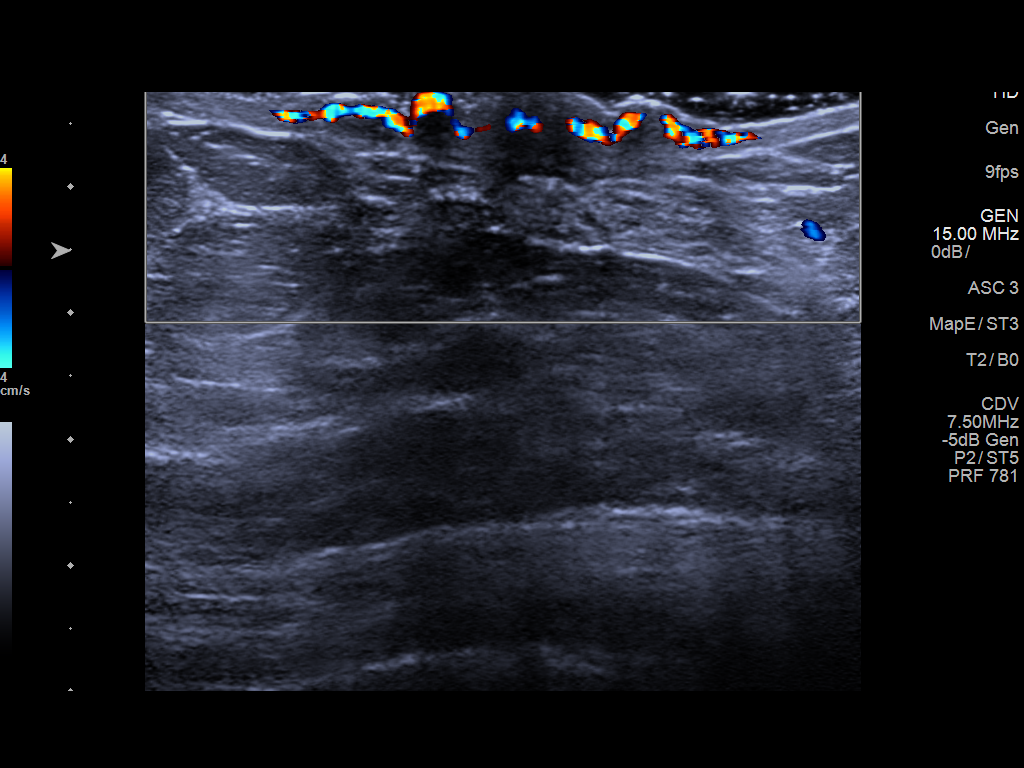

[6 of 6 positions shown; findings below may reference images not displayed]

FINDINGS: RIGHT BREAST: No mass, distortion, or acoustic shadowing is
demonstrated with ultrasound.

LEFT BREAST: No mass, distortion, or acoustic shadowing is
demonstrated with ultrasound.There is mild thickening of the left
nipple, associated with increased vascularity by Doppler exam.
IMPRESSION: 1. No evidence for malignancy.
2. Suspect inflammation or infection of the left nipple-areolar
complex.

RECOMMENDATION:
Follow-up bilateral mammography and possible bilateral US is
recommended at a dedicated breast imaging facility in 6 weeks.

This examination was performed on an emergent/urgent basis to answer
a specific clinical question and does not constitute a full
diagnostic work-up. A full diagnostic work-up at a facility that
provides diagnostic breast imaging is recommended to include
diagnostic mammography and/or repeat breast ultrasound as soon as
possible.

## 2016-01-21 ENCOUNTER — Ambulatory Visit: Payer: Medicare Other | Admitting: Psychiatry

## 2016-01-23 ENCOUNTER — Other Ambulatory Visit: Payer: Self-pay | Admitting: Psychiatry

## 2016-02-02 ENCOUNTER — Ambulatory Visit (INDEPENDENT_AMBULATORY_CARE_PROVIDER_SITE_OTHER): Payer: 59 | Admitting: Psychiatry

## 2016-02-02 ENCOUNTER — Encounter: Payer: Self-pay | Admitting: Psychiatry

## 2016-02-02 VITALS — BP 118/76 | HR 92 | Temp 97.5°F | Ht 61.0 in | Wt 185.0 lb

## 2016-02-02 DIAGNOSIS — F250 Schizoaffective disorder, bipolar type: Secondary | ICD-10-CM | POA: Diagnosis not present

## 2016-02-02 DIAGNOSIS — F411 Generalized anxiety disorder: Secondary | ICD-10-CM

## 2016-02-02 MED ORDER — TRAZODONE HCL 150 MG PO TABS: 150.0000 mg | ORAL_TABLET | Freq: Every day | ORAL | 1 refills | Status: DC

## 2016-02-02 MED ORDER — ZIPRASIDONE HCL 40 MG PO CAPS: 40.0000 mg | ORAL_CAPSULE | Freq: Every day | ORAL | 1 refills | Status: DC

## 2016-02-02 MED ORDER — ESCITALOPRAM OXALATE 5 MG PO TABS: 5.0000 mg | ORAL_TABLET | Freq: Every day | ORAL | 1 refills | Status: DC

## 2016-02-02 MED ORDER — AMANTADINE HCL 100 MG PO CAPS: 100.0000 mg | ORAL_CAPSULE | Freq: Every day | ORAL | 1 refills | Status: DC

## 2016-02-02 MED ORDER — BUPROPION HCL 75 MG PO TABS: 75.0000 mg | ORAL_TABLET | Freq: Every day | ORAL | 1 refills | Status: DC

## 2016-02-02 NOTE — Progress Notes (Signed)
Psychiatric MD Progress Note   Patient Identification: Taylor Fleming MRN:  [id number] Date of Evaluation:  02/02/2016 Referral Source: [facility]  Chief Complaint:    Visit Diagnosis:    ICD-9-CM ICD-10-CM   1. Schizoaffective disorder, bipolar type (HCC) 295.70 F25.0   2. GAD (generalized anxiety disorder) 300.02 F41.1     History of Present Illness:    Patient is a 35 year old female who was seenFor follow-up. She reported that she is becoming more anxious due to family conflict. She currently lives with her uncle and aunt cousins. She reported that everybody is fighting among each other. She is trying to stay away from that. She reported that she feels anxious that house and was looking for some medication. We discussed about using Vistaril and BuSpar but she reported that she has already used those medications. She reported that she has been compliant with her medications. She denied having any suicidal homicidal ideations or plans. She also denied having any auditory or visual hallucinations. She reported that she has been helping her son go to school in the morning. She is willing to increase the dose of trazodone at this time as she sleeps only 3 hours at night.      Associated Signs/Symptoms: Depression Symptoms:  depressed mood, fatigue, anxiety, loss of energy/fatigue, (Hypo) Manic Symptoms:  Distractibility, Flight of Ideas, Impulsivity, Irritable Mood, Labiality of Mood, Anxiety Symptoms:  Excessive Worry, Psychotic Symptoms:  denied PTSD Symptoms: Negative NA  Past Psychiatric History:  She described her by psychiatric history as follows Age 26- 78 - 7 times, suicidal attemts, cutting wrists, OD, running in front of vehicle Age17 -  Suicide attempt  Age 32- suicidal thoughts Age 33- 52- twice just going to ER Does not know the names of hospitals. Lived in [place], [place], [place] and here Was in [facility] and [facility]   Previous Psychotropic  Medications: Latuda Lithium- h/o Li  shock Seroquel lamictal- shingles   Substance Abuse History in the last 12 months:  No.  Consequences of Substance Abuse: Negative NA  Past Medical History:  Past Medical History:  Diagnosis Date  . Bipolar 1 disorder (HCC)   . Depression   . Schizophrenia [facility])     Past Surgical History:  Procedure Laterality Date  . CESAREAN SECTION    . CHOLECYSTECTOMY      Family Psychiatric History:  Kateri Mc is schizophrenic and aunt bipolar   Family History:  Family History  Problem Relation Age of Onset  . Alcohol abuse Mother   . Drug abuse Mother   . Drug abuse Father   . Alcohol abuse Father   . Alcohol abuse Sister   . Drug abuse Sister     Social History:   Social History   Social History  . Marital status: Legally Separated    Spouse name: N/A  . Number of children: N/A  . Years of education: N/A   Social History Main Topics  . Smoking status: Current Every Day Smoker    Packs/day: 1.00    Types: Cigarettes    Start date: 10/12/1992  . Smokeless tobacco: Never Used  . Alcohol use No  . Drug use: No  . Sexual activity: Not Currently   Other Topics Concern  . Not on file   Social History Narrative  . No narrative on file    Additional Social History:   Lives  With aunt, uncle, son  and cousin Married x 2  Legally married- seperated now.   Allergies:  Allergies  Allergen Reactions  . Penicillin G Swelling    Blisters in throat  . Penicillins Swelling  . Ibuprofen Rash    Metabolic Disorder Labs: Lab Results  Component Value Date   HGBA1C 4.6 12/16/2015   Lab Results  Component Value Date   PROLACTIN 27.5 (H) 12/16/2015   Lab Results  Component Value Date   CHOL 170 12/16/2015   TRIG 90 12/16/2015   HDL 41 12/16/2015   CHOLHDL 4.1 12/16/2015   VLDL 18 12/16/2015   LDLCALC 111 (H) 12/16/2015     Current Medications: Current Outpatient Prescriptions  Medication Sig Dispense Refill  .  amantadine (SYMMETREL) 100 MG capsule Take 1 capsule (100 mg total) by mouth at bedtime. 30 capsule 0  . buPROPion (WELLBUTRIN) 75 MG tablet Take 1 tablet (75 mg total) by mouth daily after breakfast. 30 tablet 0  . linaclotide (LINZESS) 145 MCG CAPS capsule Take by mouth.    . traZODone (DESYREL) 100 MG tablet Take 1 tablet (100 mg total) by mouth at bedtime. 30 tablet 0  . ziprasidone (GEODON) 40 MG capsule Take 1 capsule (40 mg total) by mouth daily with supper. 30 capsule 0   No current facility-administered medications for this visit.     Neurologic: Headache: No Seizure: No Paresthesias:No  Musculoskeletal: Strength & Muscle Tone: within normal limits Gait & Station: normal Patient leans: N/A  Psychiatric Specialty Exam: ROS  There were no vitals taken for this visit.There is no height or weight on file to calculate BMI.  General Appearance: Casual  Eye Contact:  Fair  Speech:  Slow  Volume:  Normal  Mood:  Anxious  Affect:  Congruent  Thought Process:  Coherent  Orientation:  Full (Time, Place, and Person)  Thought Content:  WDL  Suicidal Thoughts:  No  Homicidal Thoughts:  No  Memory:  Immediate;   Fair  Judgement:  Impaired  Insight:  Shallow  Psychomotor Activity:  Normal  Concentration:  Concentration: Fair and Attention Span: Fair  Recall:  [employer]  Fund of Knowledge:Fair  Language: Fair  Akathisia:  No  Handed:  Right  AIMS (if indicated):    Assets:  Communication Skills Desire for Improvement Physical Health Social Support Transportation  ADL's:  Intact  Cognition: WNL  Sleep:  4 hours    Treatment Plan Summary: Medication management  Continue medications as prescribed.  I will start her on Lexapro 5 mg daily.  She will continue on Geodon 40 mg of mood stabilization  She will continue on bupropion in the morning  She will continue on trazodone 150 mg by mouth daily at bedtime for insomnia  Follow-up in 2 months or earlier depending on her  symptoms       More than 50% of the time spent in psychoeducation, counseling and coordination of care.    This note was generated in part or whole with voice recognition software. Voice regonition is usually quite accurate but there are transcription errors that can and very often do occur. I apologize for any typographical errors that were not detected and corrected.    Brandy HaleUzma Aminat Shelburne, MD 9/12/20172:14 PM

## 2016-03-04 ENCOUNTER — Emergency Department
Admission: EM | Admit: 2016-03-04 | Discharge: 2016-03-04 | Disposition: A | Discharge status: Home / Self Care | Payer: Medicare Other | Attending: Emergency Medicine | Admitting: Emergency Medicine

## 2016-03-04 ENCOUNTER — Encounter: Payer: Self-pay | Admitting: Emergency Medicine

## 2016-03-04 DIAGNOSIS — N939 Abnormal uterine and vaginal bleeding, unspecified: Secondary | ICD-10-CM | POA: Insufficient documentation

## 2016-03-04 DIAGNOSIS — F17210 Nicotine dependence, cigarettes, uncomplicated: Secondary | ICD-10-CM | POA: Diagnosis not present

## 2016-03-04 DIAGNOSIS — K529 Noninfective gastroenteritis and colitis, unspecified: Secondary | ICD-10-CM | POA: Insufficient documentation

## 2016-03-04 DIAGNOSIS — Z79899 Other long term (current) drug therapy: Secondary | ICD-10-CM | POA: Diagnosis not present

## 2016-03-04 LAB — COMPREHENSIVE METABOLIC PANEL
ALBUMIN: 4.5 g/dL (ref 3.5–5.0)
ALT: 17 U/L (ref 14–54)
AST: 17 U/L (ref 15–41)
Alkaline Phosphatase: 94 U/L (ref 38–126)
Anion gap: 8 (ref 5–15)
BUN: 9 mg/dL (ref 6–20)
CHLORIDE: 105 mmol/L (ref 101–111)
CO2: 23 mmol/L (ref 22–32)
CREATININE: 0.76 mg/dL (ref 0.44–1.00)
Calcium: 8.9 mg/dL (ref 8.9–10.3)
GFR calc Af Amer: >60 mL/min (ref >60)
GLUCOSE: 104 mg/dL — AB (ref 65–99)
POTASSIUM: 3.8 mmol/L (ref 3.5–5.1)
SODIUM: 136 mmol/L (ref 135–145)
Total Bilirubin: 0.4 mg/dL (ref 0.3–1.2)
Total Protein: 7.6 g/dL (ref 6.5–8.1)

## 2016-03-04 LAB — CBC WITH DIFFERENTIAL/PLATELET
BASOS ABS: 0.1 10*3/uL (ref 0–0.1)
BASOS PCT: 1 %
EOS ABS: 0.1 10*3/uL (ref 0–0.7)
EOS PCT: 1 %
HCT: 43.1 % (ref 35.0–47.0)
Hemoglobin: 14.6 g/dL (ref 12.0–16.0)
LYMPHS PCT: 20 %
Lymphs Abs: 1.8 10*3/uL (ref 1.0–3.6)
MCH: 30.7 pg (ref 26.0–34.0)
MCHC: 34 g/dL (ref 32.0–36.0)
MCV: 90.5 fL (ref 80.0–100.0)
MONO ABS: 0.8 10*3/uL (ref 0.2–0.9)
Monocytes Relative: 8 %
Neutro Abs: 6.4 10*3/uL (ref 1.4–6.5)
Neutrophils Relative %: 70 %
PLATELETS: 283 10*3/uL (ref 150–440)
RBC: 4.76 MIL/uL (ref 3.80–5.20)
RDW: 13.5 % (ref 11.5–14.5)
WBC: 9.2 10*3/uL (ref 3.6–11.0)

## 2016-03-04 MED ORDER — PROMETHAZINE HCL 25 MG PO TABS: 25.0000 mg | ORAL_TABLET | ORAL | 1 refills | Status: DC | PRN

## 2016-03-04 MED ORDER — PROMETHAZINE HCL 25 MG PO TABS
50.0000 mg | ORAL_TABLET | Freq: Once | ORAL | Status: AC
  Administered 2016-03-04: 50 mg via ORAL
  Filled 2016-03-04: qty 2

## 2016-03-04 MED ORDER — OXYCODONE-ACETAMINOPHEN 5-325 MG PO TABS
2.0000 | ORAL_TABLET | Freq: Once | ORAL | Status: AC
  Administered 2016-03-04: 2 via ORAL
  Filled 2016-03-04: qty 2

## 2016-03-04 NOTE — ED Triage Notes (Addendum)
Pt arrived to ED with c/o of vaginal bleeding. Pt denies discharge but states difficulty urinating. Pt also c/o N/V/D. Pt is scheduled to have a hysterectomy Nov 3rd. Pt states she had "about 3 periods per month".

## 2016-03-04 NOTE — ED Provider Notes (Signed)
[facility] Emergency Department Provider Note        Time seen: ----------------------------------------- 2:22 PM on 03/04/2016 -----------------------------------------    I have reviewed the triage vital signs and the nursing notes.   HISTORY  Chief Complaint Vaginal Bleeding    HPI Taylor Fleming is a 35 y.o. female who presents to ER complaining of vaginal bleeding. Patient denies discharge but also notes she's having difficulty urinating. She has also had nausea, vomiting and diarrhea started in the last 24 hours. Patient states she typically has 3 menstrual cycles a month, she scheduled to have a hysterectomy on November 3. Nothing makes her symptoms better or worse.   Past Medical History:  Diagnosis Date  . Bipolar 1 disorder (HCC)   . Depression   . Schizophrenia [facility])     Patient Active Problem List   Diagnosis Date Noted  . Borderline personality disorder 12/16/2015  . Bipolar disorder (HCC) 12/16/2015  . Tobacco use disorder 12/16/2015  . Headache, migraine 04/02/2015    Past Surgical History:  Procedure Laterality Date  . CESAREAN SECTION    . CHOLECYSTECTOMY      Allergies Penicillin g; Penicillins; and Ibuprofen  Social History Social History  Substance Use Topics  . Smoking status: Current Every Day Smoker    Packs/day: 1.00    Types: Cigarettes    Start date: 10/12/1992  . Smokeless tobacco: Never Used  . Alcohol use No    Review of Systems Constitutional: Negative for fever. Cardiovascular: Negative for chest pain. Respiratory: Negative for shortness of breath. Gastrointestinal: Positive for abdominal pain, vomiting and diarrhea Genitourinary: Positive for urinary hesitancy, positive for vaginal bleeding Musculoskeletal: Negative for back pain. Skin: Negative for rash. Neurological: Negative for headaches, focal weakness or numbness.  10-point ROS otherwise  negative.  ____________________________________________   PHYSICAL EXAM:  VITAL SIGNS: ED Triage Vitals [03/04/16 1233]  Enc Vitals Group     BP 116/75     Pulse Rate 86     Resp      Temp 98.3 F (36.8 C)     Temp Source Oral     SpO2 98 %     Weight 170 lb (77.1 kg)     Height 5\' 1"  (1.549 m)     Head Circumference      Peak Flow      Pain Score 7     Pain Loc      Pain Edu?      Excl. in GC?     Constitutional: Alert and oriented. Well appearing and in no distress. Eyes: Conjunctivae are normal. PERRL. Normal extraocular movements. ENT   Head: Normocephalic and atraumatic.   Nose: No congestion/rhinnorhea.   Mouth/Throat: Mucous membranes are moist.   Neck: No stridor. Cardiovascular: Normal rate, regular rhythm. No murmurs, rubs, or gallops. Respiratory: Normal respiratory effort without tachypnea nor retractions. Breath sounds are clear and equal bilaterally. No wheezes/rales/rhonchi. Gastrointestinal: Soft and nontender. Normal bowel sounds Musculoskeletal: Nontender with normal range of motion in all extremities. No lower extremity tenderness nor edema. Neurologic:  Normal speech and language. No gross focal neurologic deficits are appreciated.  Skin:  Skin is warm, dry and intact. No rash noted. Psychiatric: Mood and affect are normal. Speech and behavior are normal.  ____________________________________________  ED COURSE:  Pertinent labs & imaging results that were available during my care of the patient were reviewed by me and considered in my medical decision making (see chart for details). Clinical Course  Patient presents  the ER in no acute distress, we will assess with basic labs, give antiemetics and reevaluate.  Procedures ____________________________________________   LABS (pertinent positives/negatives)  Labs Reviewed  COMPREHENSIVE METABOLIC PANEL - Abnormal; Notable for the following:       Result Value   Glucose, Bld 104 (*)     All other components within normal limits  CBC WITH DIFFERENTIAL/PLATELET  URINALYSIS COMPLETEWITH MICROSCOPIC ([facility] ONLY)  POC URINE PREG, ED  ____________________________________________  FINAL ASSESSMENT AND PLAN  Abnormal vaginal bleeding, gastroenteritis  Plan: Patient with labs as dictated above. Patient is in no acute distress, labs are unremarkable despite heavy vaginal bleeding. She is not currently having severe vaginal bleeding. She'll be discharged with antiemetics and encouraged to have close follow-up with her OB/GYN doctor.   Emily FilbertWilliams, Kaylla Cobos E, MD   Note: This dictation was prepared with Dragon dictation. Any transcriptional errors that result from this process are unintentional    Emily FilbertJonathan E Roy Tokarz, MD 03/04/16 [phone]
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
## 2016-03-17 ENCOUNTER — Encounter
Admission: RE | Admit: 2016-03-17 | Discharge: 2016-03-17 | Disposition: A | Discharge status: Home / Self Care | Payer: Medicare Other | Source: Ambulatory Visit | Attending: Obstetrics and Gynecology | Admitting: Obstetrics and Gynecology

## 2016-03-17 DIAGNOSIS — Z01818 Encounter for other preprocedural examination: Secondary | ICD-10-CM | POA: Diagnosis not present

## 2016-03-17 HISTORY — DX: Anemia, unspecified: D64.9

## 2016-03-17 HISTORY — DX: Personal history of urinary calculi: Z87.442

## 2016-03-17 LAB — CBC
HEMATOCRIT: 42.2 % (ref 35.0–47.0)
HEMOGLOBIN: 14.1 g/dL (ref 12.0–16.0)
MCH: 30.3 pg (ref 26.0–34.0)
MCHC: 33.5 g/dL (ref 32.0–36.0)
MCV: 90.4 fL (ref 80.0–100.0)
Platelets: 284 10*3/uL (ref 150–440)
RBC: 4.67 MIL/uL (ref 3.80–5.20)
RDW: 14 % (ref 11.5–14.5)
WBC: 11.7 10*3/uL — ABNORMAL HIGH (ref 3.6–11.0)

## 2016-03-17 LAB — BASIC METABOLIC PANEL
ANION GAP: 9 (ref 5–15)
BUN: 10 mg/dL (ref 6–20)
CHLORIDE: 104 mmol/L (ref 101–111)
CO2: 24 mmol/L (ref 22–32)
Calcium: 9 mg/dL (ref 8.9–10.3)
Creatinine, Ser: 0.82 mg/dL (ref 0.44–1.00)
GFR calc Af Amer: >60 mL/min (ref >60)
GFR calc non Af Amer: >60 mL/min (ref >60)
GLUCOSE: 88 mg/dL (ref 65–99)
POTASSIUM: 4.2 mmol/L (ref 3.5–5.1)
Sodium: 137 mmol/L (ref 135–145)

## 2016-03-17 LAB — TYPE AND SCREEN
ABO/RH(D): O POS
ANTIBODY SCREEN: NEGATIVE

## 2016-03-17 NOTE — Patient Instructions (Signed)
Your procedure is scheduled on: Friday 03/25/16 Report to Day Surgery. 2ND FLOOR MEDICAL MALL ENTRANCE To find out your arrival time please call [phone] between 1PM - 3PM on Thursday 03/24/16.  Remember: Instructions that are not followed completely may result in serious medical risk, up to and including death, or upon the discretion of your surgeon and anesthesiologist your surgery may need to be rescheduled.    __X__ 1. Do not eat food or drink liquids after midnight. No gum chewing or hard candies.     __X__ 2. No Alcohol for 24 hours before or after surgery.   ____ 3. Bring all medications with you on the day of surgery if instructed.    __X__ 4. Notify your doctor if there is any change in your medical condition     (cold, fever, infections).     Do not wear jewelry, make-up, hairpins, clips or nail polish.  Do not wear lotions, powders, or perfumes.   Do not shave 48 hours prior to surgery. Men may shave face and neck.  Do not bring valuables to the hospital.     [facility] belongings or valuables.               Contacts, dentures or bridgework may not be worn into surgery.  Leave your suitcase in the car. After surgery it may be brought to your room.  For patients admitted to the hospital, discharge time is determined by your                treatment team.   Patients discharged the day of surgery will not be allowed to drive home.   Please read over the following fact sheets that you were given:   Pain Booklet   __X__ Take these medicines the morning of surgery with A SIP OF WATER:    1. BUPROPION  2. ESCITALOPRAM  3.   4.  5.  6.  ____ Fleet Enema (as directed)   __X__ Use CHG Soap as directed  ____ Use inhalers on the day of surgery  ____ Stop metformin 2 days prior to surgery    ____ Take 1/2 of usual insulin dose the night before surgery and none on the morning of surgery.   ____ Stop Coumadin/Plavix/aspirin on   ____ Stop  Anti-inflammatories on    ____ Stop supplements until after surgery.    ____ Bring C-Pap to the hospital.

## 2016-03-24 MED ORDER — GENTAMICIN SULFATE 40 MG/ML IJ SOLN
390.0000 mg | INTRAVENOUS | Status: AC
  Administered 2016-03-25: 390 mg via INTRAVENOUS
  Filled 2016-03-24: qty 9.75

## 2016-03-24 MED ORDER — GENTAMICIN SULFATE 40 MG/ML IJ SOLN
INTRAMUSCULAR | Status: DC
  Filled 2016-03-24: qty 9.75

## 2016-03-24 MED ORDER — CLINDAMYCIN PHOSPHATE 900 MG/50ML IV SOLN: 900.0000 mg | INTRAVENOUS | Status: DC

## 2016-03-25 ENCOUNTER — Encounter
Admission: RE | Disposition: A | Discharge status: Home / Self Care | Payer: Self-pay | Source: Ambulatory Visit | Attending: Obstetrics and Gynecology

## 2016-03-25 ENCOUNTER — Inpatient Hospital Stay: Payer: Medicare Other | Admitting: Anesthesiology

## 2016-03-25 ENCOUNTER — Encounter: Payer: Self-pay | Admitting: *Deleted

## 2016-03-25 ENCOUNTER — Observation Stay
Admission: RE | Admit: 2016-03-25 | Discharge: 2016-03-26 | Disposition: A | Discharge status: Home / Self Care | Payer: Medicare Other | Source: Ambulatory Visit | Attending: Obstetrics and Gynecology | Admitting: Obstetrics and Gynecology

## 2016-03-25 DIAGNOSIS — N8302 Follicular cyst of left ovary: Secondary | ICD-10-CM | POA: Diagnosis not present

## 2016-03-25 DIAGNOSIS — Z91040 Latex allergy status: Secondary | ICD-10-CM | POA: Insufficient documentation

## 2016-03-25 DIAGNOSIS — N801 Endometriosis of ovary: Secondary | ICD-10-CM | POA: Diagnosis not present

## 2016-03-25 DIAGNOSIS — F17210 Nicotine dependence, cigarettes, uncomplicated: Secondary | ICD-10-CM | POA: Insufficient documentation

## 2016-03-25 DIAGNOSIS — N838 Other noninflammatory disorders of ovary, fallopian tube and broad ligament: Secondary | ICD-10-CM | POA: Insufficient documentation

## 2016-03-25 DIAGNOSIS — Z880 Allergy status to penicillin: Secondary | ICD-10-CM | POA: Insufficient documentation

## 2016-03-25 DIAGNOSIS — N920 Excessive and frequent menstruation with regular cycle: Secondary | ICD-10-CM | POA: Insufficient documentation

## 2016-03-25 DIAGNOSIS — N72 Inflammatory disease of cervix uteri: Secondary | ICD-10-CM | POA: Diagnosis not present

## 2016-03-25 DIAGNOSIS — N946 Dysmenorrhea, unspecified: Secondary | ICD-10-CM | POA: Diagnosis not present

## 2016-03-25 DIAGNOSIS — Z803 Family history of malignant neoplasm of breast: Secondary | ICD-10-CM | POA: Insufficient documentation

## 2016-03-25 DIAGNOSIS — F419 Anxiety disorder, unspecified: Secondary | ICD-10-CM | POA: Insufficient documentation

## 2016-03-25 DIAGNOSIS — G8929 Other chronic pain: Principal | ICD-10-CM | POA: Insufficient documentation

## 2016-03-25 DIAGNOSIS — N8301 Follicular cyst of right ovary: Secondary | ICD-10-CM | POA: Diagnosis not present

## 2016-03-25 DIAGNOSIS — Z8249 Family history of ischemic heart disease and other diseases of the circulatory system: Secondary | ICD-10-CM | POA: Diagnosis not present

## 2016-03-25 DIAGNOSIS — F209 Schizophrenia, unspecified: Secondary | ICD-10-CM | POA: Insufficient documentation

## 2016-03-25 DIAGNOSIS — D649 Anemia, unspecified: Secondary | ICD-10-CM | POA: Insufficient documentation

## 2016-03-25 DIAGNOSIS — F319 Bipolar disorder, unspecified: Secondary | ICD-10-CM | POA: Diagnosis not present

## 2016-03-25 DIAGNOSIS — Z90710 Acquired absence of both cervix and uterus: Secondary | ICD-10-CM | POA: Diagnosis present

## 2016-03-25 DIAGNOSIS — F4310 Post-traumatic stress disorder, unspecified: Secondary | ICD-10-CM | POA: Diagnosis not present

## 2016-03-25 DIAGNOSIS — Z886 Allergy status to analgesic agent status: Secondary | ICD-10-CM | POA: Insufficient documentation

## 2016-03-25 DIAGNOSIS — E119 Type 2 diabetes mellitus without complications: Secondary | ICD-10-CM | POA: Insufficient documentation

## 2016-03-25 DIAGNOSIS — Z833 Family history of diabetes mellitus: Secondary | ICD-10-CM | POA: Diagnosis not present

## 2016-03-25 DIAGNOSIS — R102 Pelvic and perineal pain: Secondary | ICD-10-CM | POA: Diagnosis present

## 2016-03-25 DIAGNOSIS — Z800 Family history of malignant neoplasm of digestive organs: Secondary | ICD-10-CM | POA: Insufficient documentation

## 2016-03-25 HISTORY — PX: LAPAROSCOPIC HYSTERECTOMY: SHX1926

## 2016-03-25 HISTORY — PX: LAPAROSCOPIC BILATERAL SALPINGECTOMY: SHX5889

## 2016-03-25 LAB — ABO/RH: ABO/RH(D): O POS

## 2016-03-25 LAB — POCT PREGNANCY, URINE: Preg Test, Ur: NEGATIVE

## 2016-03-25 SURGERY — HYSTERECTOMY, TOTAL, LAPAROSCOPIC
Anesthesia: General | Wound class: Clean

## 2016-03-25 MED ORDER — ONDANSETRON HCL 4 MG/2ML IJ SOLN
INTRAMUSCULAR | Status: DC | PRN
  Administered 2016-03-25: 4 mg via INTRAVENOUS

## 2016-03-25 MED ORDER — GABAPENTIN 300 MG PO CAPS
900.0000 mg | ORAL_CAPSULE | Freq: Every day | ORAL | Status: DC
  Administered 2016-03-25: 900 mg via ORAL
  Filled 2016-03-25: qty 3

## 2016-03-25 MED ORDER — BUPIVACAINE HCL (PF) 0.5 % IJ SOLN
INTRAMUSCULAR | Status: AC
  Filled 2016-03-25: qty 30

## 2016-03-25 MED ORDER — PROPOFOL 10 MG/ML IV BOLUS
INTRAVENOUS | Status: DC | PRN
  Administered 2016-03-25: 50 mg via INTRAVENOUS
  Administered 2016-03-25: 150 mg via INTRAVENOUS

## 2016-03-25 MED ORDER — ACETAMINOPHEN 10 MG/ML IV SOLN
INTRAVENOUS | Status: DC | PRN
  Administered 2016-03-25: 1000 mg via INTRAVENOUS

## 2016-03-25 MED ORDER — HYDROMORPHONE HCL 1 MG/ML IJ SOLN
0.5000 mg | INTRAMUSCULAR | Status: AC | PRN
  Administered 2016-03-25 (×4): 0.5 mg via INTRAVENOUS

## 2016-03-25 MED ORDER — ACETAMINOPHEN 10 MG/ML IV SOLN
INTRAVENOUS | Status: AC
  Filled 2016-03-25: qty 100

## 2016-03-25 MED ORDER — OXYCODONE HCL 5 MG/5ML PO SOLN: 5.0000 mg | Freq: Once | ORAL | Status: DC | PRN

## 2016-03-25 MED ORDER — ONDANSETRON HCL 4 MG PO TABS: 4.0000 mg | ORAL_TABLET | Freq: Four times a day (QID) | ORAL | Status: DC | PRN

## 2016-03-25 MED ORDER — ACETAMINOPHEN 500 MG PO TABS: 1000.0000 mg | ORAL_TABLET | Freq: Four times a day (QID) | ORAL | 0 refills | Status: AC | PRN

## 2016-03-25 MED ORDER — CYCLOBENZAPRINE HCL 10 MG PO TABS
10.0000 mg | ORAL_TABLET | Freq: Three times a day (TID) | ORAL | Status: DC | PRN
  Administered 2016-03-26: 10 mg via ORAL
  Filled 2016-03-25 (×4): qty 1

## 2016-03-25 MED ORDER — CLINDAMYCIN PHOSPHATE 900 MG/50ML IV SOLN
INTRAVENOUS | Status: AC
Start: 2016-03-25 — End: 2016-03-25
  Filled 2016-03-25: qty 50

## 2016-03-25 MED ORDER — PHENYLEPHRINE HCL 10 MG/ML IJ SOLN
INTRAMUSCULAR | Status: DC | PRN
  Administered 2016-03-25 (×5): 100 ug via INTRAVENOUS

## 2016-03-25 MED ORDER — LIDOCAINE HCL (CARDIAC) 20 MG/ML IV SOLN
INTRAVENOUS | Status: DC | PRN
  Administered 2016-03-25: 100 mg via INTRAVENOUS

## 2016-03-25 MED ORDER — OXYCODONE HCL 5 MG PO TABS
5.0000 mg | ORAL_TABLET | ORAL | Status: DC | PRN
  Administered 2016-03-25 – 2016-03-26 (×2): 5 mg via ORAL
  Filled 2016-03-25 (×2): qty 1

## 2016-03-25 MED ORDER — METHYLENE BLUE 0.5 % INJ SOLN
INTRAVENOUS | Status: AC
  Filled 2016-03-25: qty 10

## 2016-03-25 MED ORDER — MIDAZOLAM HCL 2 MG/2ML IJ SOLN
INTRAMUSCULAR | Status: DC | PRN
  Administered 2016-03-25: 1 mg via INTRAVENOUS
  Administered 2016-03-25: 2 mg via INTRAVENOUS

## 2016-03-25 MED ORDER — OXYCODONE HCL 5 MG PO CAPS: 5.0000 mg | ORAL_CAPSULE | Freq: Four times a day (QID) | ORAL | 0 refills | Status: DC | PRN

## 2016-03-25 MED ORDER — TRAZODONE HCL 50 MG PO TABS
150.0000 mg | ORAL_TABLET | Freq: Every day | ORAL | Status: DC
  Administered 2016-03-25: 150 mg via ORAL
  Filled 2016-03-25: qty 1
  Filled 2016-03-25: qty 3

## 2016-03-25 MED ORDER — LACTATED RINGERS IV SOLN
INTRAVENOUS | Status: DC
  Administered 2016-03-25: 09:00:00 via INTRAVENOUS

## 2016-03-25 MED ORDER — FAMOTIDINE 20 MG PO TABS
20.0000 mg | ORAL_TABLET | Freq: Once | ORAL | Status: AC
  Administered 2016-03-25: 20 mg via ORAL

## 2016-03-25 MED ORDER — ZIPRASIDONE HCL 40 MG PO CAPS
40.0000 mg | ORAL_CAPSULE | Freq: Every day | ORAL | Status: DC
  Administered 2016-03-25: 40 mg via ORAL
  Filled 2016-03-25 (×2): qty 1

## 2016-03-25 MED ORDER — FENTANYL CITRATE (PF) 100 MCG/2ML IJ SOLN
25.0000 ug | INTRAMUSCULAR | Status: DC | PRN
  Administered 2016-03-25 (×2): 50 ug via INTRAVENOUS

## 2016-03-25 MED ORDER — AMANTADINE HCL 100 MG PO CAPS
100.0000 mg | ORAL_CAPSULE | Freq: Every day | ORAL | Status: DC
  Administered 2016-03-25: 100 mg via ORAL
  Filled 2016-03-25 (×2): qty 1

## 2016-03-25 MED ORDER — ROCURONIUM BROMIDE 100 MG/10ML IV SOLN
INTRAVENOUS | Status: DC | PRN
  Administered 2016-03-25: 40 mg via INTRAVENOUS
  Administered 2016-03-25 (×2): 10 mg via INTRAVENOUS

## 2016-03-25 MED ORDER — LACTATED RINGERS IV SOLN
INTRAVENOUS | Status: DC | PRN
Start: 2016-03-25 — End: 2016-03-25
  Administered 2016-03-25: 10:00:00 via INTRAVENOUS

## 2016-03-25 MED ORDER — BUPIVACAINE HCL 0.5 % IJ SOLN
INTRAMUSCULAR | Status: DC | PRN
  Administered 2016-03-25: 10 mL

## 2016-03-25 MED ORDER — LINACLOTIDE 145 MCG PO CAPS
145.0000 ug | ORAL_CAPSULE | Freq: Every day | ORAL | Status: DC
  Filled 2016-03-25: qty 1

## 2016-03-25 MED ORDER — DEXAMETHASONE SODIUM PHOSPHATE 10 MG/ML IJ SOLN
INTRAMUSCULAR | Status: DC | PRN
  Administered 2016-03-25: 10 mg via INTRAVENOUS

## 2016-03-25 MED ORDER — ONDANSETRON HCL 4 MG/2ML IJ SOLN
4.0000 mg | Freq: Four times a day (QID) | INTRAMUSCULAR | Status: DC | PRN
Start: 2016-03-25 — End: 2016-03-26

## 2016-03-25 MED ORDER — GABAPENTIN 800 MG PO TABS: 800.0000 mg | ORAL_TABLET | Freq: Every day | ORAL | 0 refills | Status: DC

## 2016-03-25 MED ORDER — MIDAZOLAM HCL 5 MG/5ML IJ SOLN
INTRAMUSCULAR | Status: AC
  Filled 2016-03-25: qty 5

## 2016-03-25 MED ORDER — OXYCODONE HCL 5 MG PO TABS: 5.0000 mg | ORAL_TABLET | Freq: Once | ORAL | Status: DC | PRN

## 2016-03-25 MED ORDER — HYDROMORPHONE HCL 1 MG/ML IJ SOLN
INTRAMUSCULAR | Status: AC
  Filled 2016-03-25: qty 1

## 2016-03-25 MED ORDER — SUGAMMADEX SODIUM 200 MG/2ML IV SOLN
INTRAVENOUS | Status: DC | PRN
  Administered 2016-03-25: 160 mg via INTRAVENOUS

## 2016-03-25 MED ORDER — HYDROMORPHONE HCL 1 MG/ML IJ SOLN
0.2000 mg | INTRAMUSCULAR | Status: DC | PRN
  Administered 2016-03-25: 0.4 mg via INTRAVENOUS
  Administered 2016-03-26: 0.5 mg via INTRAVENOUS
  Filled 2016-03-25 (×2): qty 1

## 2016-03-25 MED ORDER — ESCITALOPRAM OXALATE 10 MG PO TABS
5.0000 mg | ORAL_TABLET | Freq: Every day | ORAL | Status: DC
  Administered 2016-03-26: 5 mg via ORAL
  Filled 2016-03-25: qty 1

## 2016-03-25 MED ORDER — DOCUSATE SODIUM 100 MG PO CAPS
100.0000 mg | ORAL_CAPSULE | Freq: Two times a day (BID) | ORAL | Status: DC
  Administered 2016-03-26: 100 mg via ORAL
  Filled 2016-03-25 (×2): qty 1

## 2016-03-25 MED ORDER — FENTANYL CITRATE (PF) 100 MCG/2ML IJ SOLN
INTRAMUSCULAR | Status: DC | PRN
  Administered 2016-03-25: 50 ug via INTRAVENOUS
  Administered 2016-03-25: 100 ug via INTRAVENOUS
  Administered 2016-03-25 (×4): 50 ug via INTRAVENOUS

## 2016-03-25 MED ORDER — ACETAMINOPHEN 500 MG PO TABS
1000.0000 mg | ORAL_TABLET | Freq: Four times a day (QID) | ORAL | Status: DC
  Administered 2016-03-25 – 2016-03-26 (×4): 1000 mg via ORAL
  Filled 2016-03-25 (×4): qty 2

## 2016-03-25 MED ORDER — MIDAZOLAM HCL 5 MG/5ML IJ SOLN
1.0000 mg | Freq: Once | INTRAMUSCULAR | Status: AC
  Administered 2016-03-25: 1 mg via INTRAVENOUS

## 2016-03-25 MED ORDER — LACTATED RINGERS IV SOLN: INTRAVENOUS | Status: DC

## 2016-03-25 MED ORDER — MENTHOL 3 MG MT LOZG
1.0000 | LOZENGE | OROMUCOSAL | Status: DC | PRN
  Filled 2016-03-25: qty 9

## 2016-03-25 MED ORDER — BUPROPION HCL 75 MG PO TABS
75.0000 mg | ORAL_TABLET | Freq: Every day | ORAL | Status: DC
  Administered 2016-03-26: 75 mg via ORAL
  Filled 2016-03-25: qty 1

## 2016-03-25 MED ORDER — FENTANYL CITRATE (PF) 100 MCG/2ML IJ SOLN
INTRAMUSCULAR | Status: AC
  Administered 2016-03-25: 50 ug via INTRAVENOUS
  Filled 2016-03-25: qty 2

## 2016-03-25 MED ORDER — LACTATED RINGERS IV SOLN
INTRAVENOUS | Status: DC
  Administered 2016-03-25 – 2016-03-26 (×2): via INTRAVENOUS

## 2016-03-25 MED ORDER — FAMOTIDINE 20 MG PO TABS
ORAL_TABLET | ORAL | Status: AC
  Filled 2016-03-25: qty 1

## 2016-03-25 SURGICAL SUPPLY — 63 items
BAG URO DRAIN 2000ML W/SPOUT (MISCELLANEOUS) ×4 IMPLANT
BLADE SURG SZ11 CARB STEEL (BLADE) ×4 IMPLANT
CATH FOLEY 2WAY  5CC 16FR (CATHETERS) ×2
CATH FOLEY SIL 2WAY 14FR5CC (CATHETERS) ×4 IMPLANT
CATH ROBINSON RED A/P 16FR (CATHETERS) IMPLANT
CATH URTH 16FR FL 2W BLN LF (CATHETERS) ×2 IMPLANT
CHLORAPREP W/TINT 26ML (MISCELLANEOUS) ×4 IMPLANT
CLOSURE WOUND 1/4X4 (GAUZE/BANDAGES/DRESSINGS) ×1
CORD MONOPOLAR M/FML 12FT (MISCELLANEOUS) ×4 IMPLANT
DEFOGGER SCOPE WARMER CLEARIFY (MISCELLANEOUS) ×4 IMPLANT
DEVICE SUTURE ENDOST 10MM (ENDOMECHANICALS) IMPLANT
DRSG TEGADERM 2-3/8X2-3/4 SM (GAUZE/BANDAGES/DRESSINGS) ×12 IMPLANT
ENDOPOUCH RETRIEVER 10 (MISCELLANEOUS) ×4 IMPLANT
ENDOSTITCH 0 SINGLE 48 (SUTURE) IMPLANT
GAUZE SPONGE NON-WVN 2X2 STRL (MISCELLANEOUS) ×4 IMPLANT
GLOVE BIO SURGEON STRL SZ 6.5 (GLOVE) ×6 IMPLANT
GLOVE BIO SURGEON STRL SZ7 (GLOVE) ×16 IMPLANT
GLOVE BIO SURGEONS STRL SZ 6.5 (GLOVE) ×2
GLOVE INDICATOR 7.0 STRL GRN (GLOVE) ×4 IMPLANT
GLOVE INDICATOR 7.5 STRL GRN (GLOVE) ×16 IMPLANT
GOWN STRL REUS W/ TWL LRG LVL3 (GOWN DISPOSABLE) ×4 IMPLANT
GOWN STRL REUS W/ TWL XL LVL3 (GOWN DISPOSABLE) ×2 IMPLANT
GOWN STRL REUS W/TWL LRG LVL3 (GOWN DISPOSABLE) ×4
GOWN STRL REUS W/TWL XL LVL3 (GOWN DISPOSABLE) ×2
IRRIGATION STRYKERFLOW (MISCELLANEOUS) ×2 IMPLANT
IRRIGATOR STRYKERFLOW (MISCELLANEOUS) ×4
IV LACTATED RINGERS 1000ML (IV SOLUTION) ×4 IMPLANT
IV NS 1000ML (IV SOLUTION) ×2
IV NS 1000ML BAXH (IV SOLUTION) ×2 IMPLANT
KIT RM TURNOVER CYSTO AR (KITS) ×4 IMPLANT
LABEL OR SOLS (LABEL) ×4 IMPLANT
LIGASURE 5MM LAPAROSCOPIC (INSTRUMENTS) ×4 IMPLANT
LIGASURE BLUNT 5MM 37CM (INSTRUMENTS) ×4 IMPLANT
LIQUID BAND (GAUZE/BANDAGES/DRESSINGS) ×4 IMPLANT
MANIPULATOR VCARE LG CRV RETR (MISCELLANEOUS) IMPLANT
MANIPULATOR VCARE SML CRV RETR (MISCELLANEOUS) ×4 IMPLANT
MANIPULATOR VCARE STD CRV RETR (MISCELLANEOUS) IMPLANT
NEEDLE VERESS 14GA 120MM (NEEDLE) ×4 IMPLANT
NS IRRIG 500ML POUR BTL (IV SOLUTION) ×4 IMPLANT
OCCLUDER COLPOPNEUMO (BALLOONS) ×4 IMPLANT
PACK GYN LAPAROSCOPIC (MISCELLANEOUS) ×4 IMPLANT
PAD OB MATERNITY 4.3X12.25 (PERSONAL CARE ITEMS) ×4 IMPLANT
PAD PREP 24X41 OB/GYN DISP (PERSONAL CARE ITEMS) ×4 IMPLANT
SCISSORS METZENBAUM CVD 33 (INSTRUMENTS) ×4 IMPLANT
SET CYSTO W/LG BORE CLAMP LF (SET/KITS/TRAYS/PACK) ×4 IMPLANT
SLEEVE ENDOPATH XCEL 5M (ENDOMECHANICALS) ×8 IMPLANT
SPONGE LAP 18X18 5 PK (GAUZE/BANDAGES/DRESSINGS) ×4 IMPLANT
SPONGE VERSALON 2X2 STRL (MISCELLANEOUS) ×4
STRIP CLOSURE SKIN 1/4X4 (GAUZE/BANDAGES/DRESSINGS) ×3 IMPLANT
SURGILUBE 2OZ TUBE FLIPTOP (MISCELLANEOUS) ×4 IMPLANT
SUT MNCRL AB 4-0 PS2 18 (SUTURE) ×4 IMPLANT
SUT VIC AB 2-0 UR6 27 (SUTURE) ×4 IMPLANT
SUT VIC AB 4-0 SH 27 (SUTURE) ×2
SUT VIC AB 4-0 SH 27XANBCTRL (SUTURE) ×2 IMPLANT
SWABSTK COMLB BENZOIN TINCTURE (MISCELLANEOUS) ×4 IMPLANT
SYR 50ML LL SCALE MARK (SYRINGE) ×4 IMPLANT
SYRINGE 10CC LL (SYRINGE) ×4 IMPLANT
SYRINGE IRR TOOMEY STRL 70CC (SYRINGE) ×4 IMPLANT
TROCAR ENDO BLADELESS 11MM (ENDOMECHANICALS) ×4 IMPLANT
TROCAR XCEL NON-BLD 5MMX100MML (ENDOMECHANICALS) ×4 IMPLANT
TROCAR XCEL UNIV SLVE 11M 100M (ENDOMECHANICALS) ×4 IMPLANT
TUBING INSUFFLATOR HEATED (MISCELLANEOUS) ×4 IMPLANT
TUBING INSUFFLATOR HI FLOW (MISCELLANEOUS) ×4 IMPLANT
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
## 2016-03-25 NOTE — Transfer of Care (Signed)
Immediate Anesthesia Transfer of Care Note  Patient: Ola SpurrMaria Brinkmeier  Procedure(s) Performed: Procedure(s): HYSTERECTOMY TOTAL LAPAROSCOPIC (N/A) LAPAROSCOPIC BILATERAL SALPINGECTOMY (Bilateral)  Patient Location: PACU  Anesthesia Type:General  Level of Consciousness: sedated  Airway & Oxygen Therapy: Patient Spontanous Breathing and Patient connected to face mask oxygen  Post-op Assessment: Report given to RN and Post -op Vital signs reviewed and stable  Post vital signs: Reviewed and stable  Last Vitals:  Vitals:   03/25/16 0829 03/25/16 1228  BP: 133/83 113/68  Pulse: 77 85  Resp: 15 17  Temp: 36.7 C 36.7 C    Complications: No apparent anesthesia complications

## 2016-03-25 NOTE — Anesthesia Procedure Notes (Signed)
Procedure Name: Intubation Date/Time: 03/25/2016 9:38 AM Performed by: Stormy FabianURTIS, Ashaki Frosch Pre-anesthesia Checklist: Patient identified, Patient being monitored, Timeout performed, Emergency Drugs available and Suction available Patient Re-evaluated:Patient Re-evaluated prior to inductionOxygen Delivery Method: Circle system utilized Preoxygenation: Pre-oxygenation with 100% oxygen Intubation Type: IV induction Ventilation: Mask ventilation without difficulty Laryngoscope Size: Mac and 3 Grade View: Grade I Tube type: Oral Tube size: 7.0 mm Number of attempts: 1 Airway Equipment and Method: Stylet Placement Confirmation: ETT inserted through vocal cords under direct vision,  positive ETCO2 and breath sounds checked- equal and bilateral Secured at: 21 cm Tube secured with: Tape Dental Injury: Teeth and Oropharynx as per pre-operative assessment

## 2016-03-25 NOTE — Anesthesia Postprocedure Evaluation (Signed)
Anesthesia Post Note  Patient: Taylor Fleming  Procedure(s) Performed: Procedure(s) (LRB): HYSTERECTOMY TOTAL LAPAROSCOPIC (N/A) LAPAROSCOPIC BILATERAL SALPINGECTOMY (Bilateral)  Patient location during evaluation: PACU Anesthesia Type: General Level of consciousness: awake and alert Pain management: pain level controlled Vital Signs Assessment: post-procedure vital signs reviewed and stable Respiratory status: spontaneous breathing, nonlabored ventilation, respiratory function stable and patient connected to nasal cannula oxygen Cardiovascular status: blood pressure returned to baseline and stable Postop Assessment: no signs of nausea or vomiting Anesthetic complications: no    Last Vitals:  Vitals:   03/25/16 1358 03/25/16 1404  BP: (!) 94/55   Pulse: 73 74  Resp: 13 12  Temp:  36.6 C    Last Pain:  Vitals:   03/25/16 1358  TempSrc:   PainSc: Asleep                 Cleda MccreedyJoseph K Ayuub Penley

## 2016-03-25 NOTE — Op Note (Signed)
Taylor SpurrMaria Whitham PROCEDURE DATE: 03/25/2016  INDICATION: Pelvic pain  PREOPERATIVE DIAGNOSIS:  - abnormal uterine bleeding, failed medical management - endometriosis - abnormal pap smear - chronic pelvic pain  POSTOPERATIVE DIAGNOSIS: The same PROCEDURE: Total laparoscopic hysterectomy with vaginal closure, bilateral salpingectomy SURGEON:  Dr. Christeen DouglasBethany Salwa Bai ASSISTANT: Dr. Leeroy Bockhelsea Ward Anesthesiologist: Rosaria FerriesJoseph K Piscitello, MD Anesthesiologist: Rosaria FerriesJoseph K Piscitello, MD; Yves DillPaul Carroll, MD CRNA: Mathews ArgyleBenjamin Logan, CRNA; Stormy FabianLinda Curtis, CRNA  INDICATIONS: 35 y.o. [phone]  here for definitive surgical management secondary to the indications listed under preoperative diagnoses; please see preoperative note for further details.  Risks of surgery were discussed with the patient including but not limited to: bleeding which may require transfusion or reoperation; infection which may require antibiotics; injury to bowel, bladder, ureters or other surrounding organs; need for additional procedures; thromboembolic phenomenon, incisional problems and other postoperative/anesthesia complications. Written informed consent was obtained.    FINDINGS:  Normal cervix and vagina and perineum. Normal upper abdomen. Normal-appearing uterus and pelvis, with mild adhesions between the uterus and the bladder. Allen-Masters windows noted in the posterior cul-de-sac. Normal fallopian tubes and ovaries bilaterally. Ruptured cyst on the right ovary, with endometriosis implants apparent on the ovary.  ANESTHESIA:    General INTRAVENOUS FLUIDS: 1000  ml ESTIMATED BLOOD LOSS: 50 ml URINE OUTPUT: 100 ml   SPECIMENS: Uterus, cervix, bilateral fallopian tubes  COMPLICATIONS: None immediate  PROCEDURE IN DETAIL:  The patient received prophalactic intravenous antibiotics and had sequential compression devices applied to her lower extremities while in the preoperative area.  She was then taken to the operating room where general  anesthesia was administered and was found to be adequate.  She was placed in the dorsal lithotomy position, and was prepped and draped in a sterile manner.  A formal time out was performed with all team members present and in agreement.  A small V-care uterine manipulator was placed at this time.  A Foley catheter was inserted into her bladder and attached to constant drainage. Attention was turned to the abdomen and 0.5% Marcaine infused subq. A 5mm umbilical incision was made with the scalpel.  The Optiview 5-mm trocar and sleeve were then advanced without difficulty with the laparoscope under direct visualization into the abdomen.  The abdomen was then insufflated with carbon dioxide gas and adequate pneumoperitoneum was obtained.  A survey of the patient's pelvis and abdomen revealed the findings above.  Bilateral lower quadrant ports (5 mm on the right and 5 mm on the left) were then placed under direct visualization.  The pelvis was then carefully examined.  Attention was turned to the fallopian tubes; these were freed from the underlying mesosalpinx and the uterine attachments using the Ligasure device.  The bilateral round and broad ligaments were then clamped and transected with the Ligasure device.  The uterine artery was then skeletonized and a bladder flap was created.  The ureters were noted to be safely away from the area of dissection.  The bladder was then bluntly dissected off the lower uterine segment.    At this point, attention was turned to the uterine vessels, which were clamped and cauterized using the Ligasure on the left, and then the right. After the uterine blood flow at the level of the internal os was controlled, both arteries were cut with the Ligasure.  Good hemostasis was noted overall.  The uterosacral and cardinal ligaments were clamped, cut and ligated bilaterally .  Attention was then turned to the cervicovaginal junction, and monopolar scissors were used to  transect the  cervix from the surrounding vagina using the ring of the V-care as a guide. This was done circumferentially allowing total hysterectomy.  The uterus was then removed from the vagina and the vaginal cuff incision was then closed with running 0 Vicryl from below.  Overall excellent hemostasis was noted.  Some bleeding was noted at the 3 and 6:00 positions of the cervical cuff. This was cauterized with monopolar cautery.  Attention was returned to the abdomen.The ureters were reexamined bilaterally and were pulsating normally. The abdominal pressure was reduced and hemostasis was confirmed.  500 mL of warm saline was instilled into the peritoneal cavity to assist with pain management. All trocars were removed under direct visualization, and the abdomen was desufflated.  All skin incisions were closed with 4-0 Vicryl subcuticular stitches and Dermabond. The patient tolerated the procedures well.  All instruments, needles, and sponge counts were correct x 2. The patient was taken to the recovery room awake, extubated and in stable condition.   Because of her history of significant pelvic pain, and her allergy to NSAIDs, scheduled Tylenol, and gabapentin will be used in conjunction with narcotics as needed for pain management postoperatively.

## 2016-03-25 NOTE — Progress Notes (Signed)
Day of Surgery Procedure(s) (LRB): HYSTERECTOMY TOTAL LAPAROSCOPIC (N/A) LAPAROSCOPIC BILATERAL SALPINGECTOMY (Bilateral)  Subjective: Patient reports nausea and incisional pain.    Objective: I have reviewed patient's vital signs, intake and output, medications and labs.  General: alert, cooperative and mild distress Resp: clear to auscultation bilaterally Cardio: regular rate and rhythm, S1, S2 normal, no murmur, click, rub or gallop GI: soft, aprropriatley-tender; bowel sounds normal; no masses,  no organomegaly and incision: clean, dry and intact Extremities: extremities normal, atraumatic, no cyanosis or edema  Assessment: s/p Procedure(s): HYSTERECTOMY TOTAL LAPAROSCOPIC (N/A) LAPAROSCOPIC BILATERAL SALPINGECTOMY (Bilateral): stable  Plan: Advance diet Encourage ambulation Advance to PO medication  LOS: 1 day   PO pain meds overnight. Flexeril prn. IV dilaudid rescue  Taylor Fleming 03/25/2016, 5:53 PM

## 2016-03-25 NOTE — Interval H&P Note (Signed)
History and Physical Interval Note:  03/25/2016 9:15 AM  Taylor Fleming  has presented today for surgery, with the diagnosis of AUB  The various methods of treatment have been discussed with the patient and family. After consideration of risks, benefits and other options for treatment, the patient has consented to  Procedure(s): HYSTERECTOMY TOTAL LAPAROSCOPIC (N/A) LAPAROSCOPIC BILATERAL SALPINGECTOMY (Bilateral) CYSTOSCOPY (N/A) HYSTERECTOMY ABDOMINAL WITH SALPINGECTOMY (Bilateral) possibly as a surgical intervention .  The patient's history has been reviewed, patient examined, no change in status, stable for surgery.  I have reviewed the patient's chart and labs.  Questions were answered to the patient's satisfaction.     Christeen DouglasBEASLEY, [place]

## 2016-03-25 NOTE — Anesthesia Preprocedure Evaluation (Addendum)
Anesthesia Evaluation  Patient identified by MRN, date of birth, ID band Patient awake    Reviewed: Allergy & Precautions, H&P , NPO status , Patient's Chart, lab work & pertinent test results  History of Anesthesia Complications Negative for: history of anesthetic complications  Airway Mallampati: III  TM Distance: >3 FB Neck ROM: full    Dental no notable dental hx. (+) Poor Dentition, Chipped   Pulmonary neg shortness of breath, Current Smoker,    Pulmonary exam normal breath sounds clear to auscultation       Cardiovascular Exercise Tolerance: Good (-) angina(-) Past MI and (-) DOE negative cardio ROS Normal cardiovascular exam Rhythm:regular Rate:Normal     Neuro/Psych  Headaches, PSYCHIATRIC DISORDERS Depression Bipolar Disorder Schizophrenia    GI/Hepatic negative GI ROS, Neg liver ROS,   Endo/Other  negative endocrine ROS  Renal/GU negative Renal ROS     Musculoskeletal negative musculoskeletal ROS (+)   Abdominal Normal abdominal exam  (+)   Peds  Hematology  (+) anemia ,   Anesthesia Other Findings Signs and symptoms suggestive of sleep apnea   Past Medical History: No date: Anemia No date: Bipolar 1 disorder (HCC) No date: Depression No date: History of kidney stones No date: Schizophrenia (HCC)  Past Surgical History: No date: CESAREAN SECTION No date: CHOLECYSTECTOMY No date: DIAGNOSTIC LAPAROSCOPY  BMI    Body Mass Index:  32.12 kg/m      Reproductive/Obstetrics negative OB ROS                            Anesthesia Physical Anesthesia Plan  ASA: III  Anesthesia Plan: General ETT   Post-op Pain Management:    Induction:   Airway Management Planned: Oral ETT  Additional Equipment:   Intra-op Plan:   Post-operative Plan: Extubation in OR  Informed Consent: I have reviewed the patients History and Physical, chart, labs and discussed the procedure  including the risks, benefits and alternatives for the proposed anesthesia with the patient or authorized representative who has indicated his/her understanding and acceptance.     Plan Discussed with: Anesthesiologist, CRNA and Surgeon  Anesthesia Plan Comments:        Anesthesia Quick Evaluation

## 2016-03-26 DIAGNOSIS — G8929 Other chronic pain: Secondary | ICD-10-CM | POA: Diagnosis not present

## 2016-03-26 LAB — BASIC METABOLIC PANEL
Anion gap: 5 (ref 5–15)
BUN: 5 mg/dL — AB (ref 6–20)
CHLORIDE: 109 mmol/L (ref 101–111)
CO2: 23 mmol/L (ref 22–32)
CREATININE: 0.7 mg/dL (ref 0.44–1.00)
Calcium: 8.1 mg/dL — ABNORMAL LOW (ref 8.9–10.3)
GFR calc Af Amer: >60 mL/min (ref >60)
GFR calc non Af Amer: >60 mL/min (ref >60)
Glucose, Bld: 120 mg/dL — ABNORMAL HIGH (ref 65–99)
POTASSIUM: 3.7 mmol/L (ref 3.5–5.1)
Sodium: 137 mmol/L (ref 135–145)

## 2016-03-26 LAB — CBC
HCT: 35.2 % (ref 35.0–47.0)
Hemoglobin: 11.7 g/dL — ABNORMAL LOW (ref 12.0–16.0)
MCH: 30.5 pg (ref 26.0–34.0)
MCHC: 33.3 g/dL (ref 32.0–36.0)
MCV: 91.6 fL (ref 80.0–100.0)
PLATELETS: 216 10*3/uL (ref 150–440)
RBC: 3.84 MIL/uL (ref 3.80–5.20)
RDW: 13.5 % (ref 11.5–14.5)
WBC: 16.8 10*3/uL — AB (ref 3.6–11.0)

## 2016-03-26 MED ORDER — CYCLOBENZAPRINE HCL 10 MG PO TABS: 10.0000 mg | ORAL_TABLET | Freq: Three times a day (TID) | ORAL | 2 refills | Status: AC | PRN

## 2016-03-26 MED ORDER — OXYCODONE HCL 5 MG PO CAPS: 5.0000 mg | ORAL_CAPSULE | Freq: Four times a day (QID) | ORAL | 0 refills | Status: AC | PRN

## 2016-03-26 NOTE — Progress Notes (Signed)
Pt discharged home via wheelchair by nursing.

## 2016-03-26 NOTE — Discharge Instructions (Signed)
Signs and Symptoms to Report Call our office at [phone] if you have any of the following.   Fever over 100.4 degrees or higher  Severe stomach pain not relieved with pain medications  Bright red bleeding thats heavier than a period that does not slow with rest  To go the bathroom a lot (frequency), you cant hold your urine (urgency), or it hurts when you empty your bladder (urinate)  Chest pain  Shortness of breath  Pain in the calves of your legs  Severe nausea and vomiting not relieved with anti-nausea medications  Signs of infection around your wounds, such as redness, hot to touch, swelling, green/yellow drainage (like pus), bad smelling discharge  Any concerns  What You Can Expect after Surgery  You may see some pink tinged, bloody fluid and bruising around the wound. This is normal.  You may notice shoulder and neck pain. This is caused by the gas used during surgery to expand your abdomen so your surgeon could get to the uterus easier.  You may have a sore throat because of the tube in your mouth during general anesthesia. This will go away in 2 to 3 days.  You may have some stomach cramps.  You may notice spotting on your panties.  You may have pain around the incision sites.   Activities after Your Discharge Follow these guidelines to help speed your recovery at home:  Do the coughing and deep breathing as you did in the hospital for 2 weeks. Use the small blue breathing device, called the incentive spirometer for 2 weeks.  Dont drive if you are in pain or taking narcotic pain medicine. You may drive when you can safely slam on the brakes, turn the wheel forcefully, and rotate your torso comfortably. This is typically 1-2 weeks. Practice in a parking lot or side street prior to attempting to drive regularly.   Ask others to help with household chores for 4 weeks.  Do not lift anything heavier that 10 pounds for 4-6 weeks. This includes pets,  children, and groceries.  Dont do strenuous activities, exercises, or sports like vacuuming, tennis, squash, etc. until your doctor says it is safe to do so. ---If you had a hysterectomy (abdominal, laparoscopic, or vaginal) do not have intercourse for 8-10 weeks.   Walk as you feel able. Rest often since it may take two or three weeks for your energy level to return to normal.   You may climb stairs  Avoid constipation:   -Eat fruits, vegetables, and whole grains. Eat small meals as your appetite will take time to return to normal.   -Drink 6 to 8 glasses of water each day unless your doctor has told you to limit your fluids.   -Use a laxative or stool softener as needed if constipation becomes a problem. You may take Miralax, metamucil, Citrucil, Colace, Senekot, FiberCon, etc. If this does not relieve the constipation, try two tablespoons of Milk Of Magnesia every 8 hours until your bowels move.   You may shower. Gently wash the wounds with a mild soap and water. Pat dry.  Do not get in a hot tub, swimming pool, etc. until your doctor agrees.  Do not use lotions, oils, powders on the wounds.  Do not douche, use tampons, or have sex until your doctor says it is okay.  Take your pain medicine when you need it. The medicine may not work as well if the pain is bad.  Take the medicines you were  taking before surgery. Other medications you will need are pain medications (Norco or Percocet) and nausea medications (Zofran).  Laparoscopic pelvic Surgery Discharge Instructions  RISKS AND COMPLICATIONS   Infection.  Bleeding.  Injury to surrounding organs.  Anesthetic side effects.   PROCEDURE   You may be given a medicine to help you relax (sedative) before the procedure. You will be given a medicine to make you sleep (general anesthetic) during the procedure.  A tube will be put down your throat to help your breath while under general anesthesia.  Several small cuts (incisions)  are made in the lower abdominal area and one incision is made near the belly button.  Your abdominal area will be inflated with a safe gas (carbon dioxide). This helps give the surgeon room to operate, visualize, and helps the surgeon avoid other organs.  A thin, lighted tube (laparoscope) with a camera attached is inserted into your abdomen through the incision near the belly button. Other small instruments may also be inserted through other abdominal incisions.  The ovary is located and are removed.  After the ovary is removed, the gas is released from the abdomen.  The incisions will be closed with stitches (sutures), and Dermabond. A bandage may be placed over the incisions.  AFTER THE PROCEDURE   You will also have some mild abdominal discomfort for 3-7 days. You will be given pain medicine to ease any discomfort.  As long as there are no problems, you may be allowed to go home. Someone will need to drive you home and be with you for at least 24 hours once home.  You may have some mild discomfort in the throat. This is from the tube placed in your throat while you were sleeping.  You may experience discomfort in the shoulder area from some trapped air between the liver and diaphragm. This sensation is normal and will slowly go away on its own.  HOME CARE INSTRUCTIONS   Take all medicines as directed.  Only take over-the-counter or prescription medicines for pain, discomfort, or fever as directed by your caregiver.  Resume daily activities as directed.  Showers are preferred over baths for 2 weeks.  You may resume sexual activities in 1 week or as you feel you would like to.  Do not drive while taking narcotics.  SEEK MEDICAL CARE IF: .  There is increasing abdominal pain.  You feel lightheaded or faint.  You have the chills.  You have an oral temperature above 102 F (38.9 C).  There is pus-like (purulent) drainage from any of the wounds.  You are unable to pass  gas or have a bowel movement.  You feel sick to your stomach (nauseous) or throw up (vomit) and can't control it with your medicines.  MAKE SURE YOU:   Understand these instructions.  Will watch your condition.  Will get help right away if you are not doing well or get worse.  ExitCare Patient Information 2013 [employer] [place].  Oxycodone tablets or capsules What is this medicine? OXYCODONE (ox i KOE done) is a pain reliever. It is used to treat moderate to severe pain. This medicine may be used for other purposes; ask your health care provider or pharmacist if you have questions. What should I tell my health care provider before I take this medicine? They need to know if you have any of these conditions: -Addison's disease -brain tumor -head injury -heart disease -history of drug or alcohol abuse problem -if you often drink alcohol -  kidney disease -liver disease -lung or breathing disease, like asthma -mental illness -pancreatic disease -seizures -thyroid disease -an unusual or allergic reaction to oxycodone, codeine, hydrocodone, morphine, other medicines, foods, dyes, or preservatives -pregnant or trying to get pregnant -breast-feeding How should I use this medicine? Take this medicine by mouth with a glass of water. Follow the directions on the prescription label. You can take it with or without food. If it upsets your stomach, take it with food. Take your medicine at regular intervals. Do not take it more often than directed. Do not stop taking except on your doctor's advice. Some brands of this medicine, like Oxecta, have special instructions. Ask your doctor or pharmacist if these directions are for you: Do not cut, crush or chew this medicine. Swallow only one tablet at a time. Do not wet, soak, or lick the tablet before you take it. Talk to your pediatrician regarding the use of this medicine in children. Special care may be needed. Overdosage: If you think you have  taken too much of this medicine contact a poison control center or emergency room at once. NOTE: This medicine is only for you. Do not share this medicine with others. What if I miss a dose? If you miss a dose, take it as soon as you can. If it is almost time for your next dose, take only that dose. Do not take double or extra doses. What may interact with this medicine? -alcohol -antihistamines -certain medicines used for nausea like chlorpromazine, droperidol -erythromycin -ketoconazole -medicines for depression, anxiety, or psychotic disturbances -medicines for sleep -muscle relaxants -naloxone -naltrexone -narcotic medicines (opiates) for pain -nilotinib -phenobarbital -phenytoin -rifampin -ritonavir -voriconazole This list may not describe all possible interactions. Give your health care provider a list of all the medicines, herbs, non-prescription drugs, or dietary supplements you use. Also tell them if you smoke, drink alcohol, or use illegal drugs. Some items may interact with your medicine. What should I watch for while using this medicine? Tell your doctor or health care professional if your pain does not go away, if it gets worse, or if you have new or a different type of pain. You may develop tolerance to the medicine. Tolerance means that you will need a higher dose of the medicine for pain relief. Tolerance is normal and is expected if you take this medicine for a long time. Do not suddenly stop taking your medicine because you may develop a severe reaction. Your body becomes used to the medicine. This does NOT mean you are addicted. Addiction is a behavior related to getting and using a drug for a non-medical reason. If you have pain, you have a medical reason to take pain medicine. Your doctor will tell you how much medicine to take. If your doctor wants you to stop the medicine, the dose will be slowly lowered over time to avoid any side effects. You may get drowsy or dizzy  when you first start taking this medicine or change doses. Do not drive, use machinery, or do anything that may be dangerous until you know how the medicine affects you. Stand or sit up slowly. There are different types of narcotic medicines (opiates) for pain. If you take more than one type at the same time, you may have more side effects. Give your health care provider a list of all medicines you use. Your doctor will tell you how much medicine to take. Do not take more medicine than directed. Call emergency for help if you have  problems breathing. This medicine will cause constipation. Try to have a bowel movement at least every 2 to 3 days. If you do not have a bowel movement for 3 days, call your doctor or health care professional. Your mouth may get dry. Drinking water, chewing sugarless gum, or sucking on hard candy may help. See your dentist every 6 months. What side effects may I notice from receiving this medicine? Side effects that you should report to your doctor or health care professional as soon as possible: -allergic reactions like skin rash, itching or hives, swelling of the face, lips, or tongue -breathing problems -confusion -feeling faint or lightheaded, falls -trouble passing urine or change in the amount of urine -unusually weak or tired Side effects that usually do not require medical attention (report to your doctor or health care professional if they continue or are bothersome): -constipation -dry mouth -itching -nausea, vomiting -upset stomach This list may not describe all possible side effects. Call your doctor for medical advice about side effects. You may report side effects to FDA at 1-800-FDA-1088. Where should I keep my medicine? Keep out of the reach of children. This medicine can be abused. Keep your medicine in a safe place to protect it from theft. Do not share this medicine with anyone. Selling or giving away this medicine is dangerous and against the  law. Store at room temperature between 15 and 30 degrees C (59 and 86 degrees F). Protect from light. Keep container tightly closed. This medicine may cause accidental overdose and death if it is taken by other adults, children, or pets. Flush any unused medicine down the toilet to reduce the chance of harm. Do not use the medicine after the expiration date. NOTE: This sheet is a summary. It may not cover all possible information. If you have questions about this medicine, talk to your doctor, pharmacist, or health care provider.    2016, Elsevier/Gold Standard. (2014-09-20 01:15:14)

## 2016-03-26 NOTE — Progress Notes (Signed)
Verbal and written instructions provided, pt and friend at bedside states understanding. Prescription given to patient.

## 2016-03-26 NOTE — Discharge Summary (Signed)
1 Day Post-Op Subjective: The patient is doing well.  No nausea or vomiting. Pain is adequately controlled but needing iv meds - this is expected with her hx of endometriosis and chronic myalgias  Objective: Vital signs in last 24 hours: Temp:  [97.5 F (36.4 C)-98.7 F (37.1 C)] 98.7 F (37.1 C) (11/04 0751) Pulse Rate:  [73-100] 80 (11/04 0751) Resp:  [11-20] 18 (11/04 0751) BP: (94-145)/(53-78) 122/64 (11/04 0751) SpO2:  [93 %-100 %] 99 % (11/04 0751)  Intake/Output  Intake/Output Summary (Last 24 hours) at 03/26/16 1122 Last data filed at 03/26/16 0530  Gross per 24 hour  Intake             1550 ml  Output             2400 ml  Net             -850 ml    Physical Exam:  General: Alert and oriented. CV: RRR Lungs: Clear bilaterally. GI: Soft, Nondistended. Incisions: Clean and dry. Urine: Clear Extremities: Nontender, no erythema, no edema.  Lab Results:  Recent Labs  03/26/16 0605  HGB 11.7*  HCT 35.2  WBC 16.8*  PLT 216                 Results for orders placed or performed during the hospital encounter of 03/25/16 (from the past 24 hour(s))  CBC     Status: Abnormal   Collection Time: 03/26/16  6:05 AM  Result Value Ref Range   WBC 16.8 (H) 3.6 - 11.0 K/uL   RBC 3.84 3.80 - 5.20 MIL/uL   Hemoglobin 11.7 (L) 12.0 - 16.0 g/dL   HCT 16.135.2 09.635.0 - 04.547.0 %   MCV 91.6 80.0 - 100.0 fL   MCH 30.5 26.0 - 34.0 pg   MCHC 33.3 32.0 - 36.0 g/dL   RDW 40.913.5 81.111.5 - 91.414.5 %   Platelets 216 150 - 440 K/uL  Basic metabolic panel     Status: Abnormal   Collection Time: 03/26/16  6:05 AM  Result Value Ref Range   Sodium 137 135 - 145 mmol/L   Potassium 3.7 3.5 - 5.1 mmol/L   Chloride 109 101 - 111 mmol/L   CO2 23 22 - 32 mmol/L   Glucose, Bld 120 (H) 65 - 99 mg/dL   BUN 5 (L) 6 - 20 mg/dL   Creatinine, Ser 7.820.70 0.44 - 1.00 mg/dL   Calcium 8.1 (L) 8.9 - 10.3 mg/dL   GFR calc non Af Amer >60 >60 mL/min   GFR calc Af Amer >60 >60 mL/min   Anion gap 5 5 - 15     Assessment/Plan: POD# 1 s/p TLH, BS for AUB and chronic pelvic pain.  1) Ambulate, Incentive spirometry 2) Advance diet as tolerated 3) Transition to oral pain medication - ERAS without NSAIDs and +flexiril  4) D/C Foley 5) Discharge home today anticipated    Christeen DouglasBethany Kalid Ghan, MD   LOS: 1 day   Nissi Doffing 03/26/2016, 11:22 AM

## 2016-03-27 NOTE — Care Management Obs Status (Signed)
MEDICARE OBSERVATION STATUS NOTIFICATION   Patient Details  Name: Ola SpurrMaria Holycross MRN: [id number] Date of Birth: [birth year]   Medicare Observation Status Notification Given:  No (discharge order in less than 24 hours)    Caren MacadamMichelle Elvi Leventhal, RN 03/27/2016, 8:24 AM

## 2016-03-28 LAB — SURGICAL PATHOLOGY

## 2016-03-30 ENCOUNTER — Ambulatory Visit: Payer: Medicare Other | Admitting: Physical Therapy

## 2016-03-31 ENCOUNTER — Ambulatory Visit (INDEPENDENT_AMBULATORY_CARE_PROVIDER_SITE_OTHER): Payer: 59 | Admitting: Psychiatry

## 2016-03-31 ENCOUNTER — Encounter: Payer: Self-pay | Admitting: Psychiatry

## 2016-03-31 VITALS — BP 108/70 | HR 78 | Ht 61.25 in | Wt 176.0 lb

## 2016-03-31 DIAGNOSIS — F250 Schizoaffective disorder, bipolar type: Secondary | ICD-10-CM

## 2016-03-31 DIAGNOSIS — F411 Generalized anxiety disorder: Secondary | ICD-10-CM

## 2016-03-31 MED ORDER — ZIPRASIDONE HCL 40 MG PO CAPS: 40.0000 mg | ORAL_CAPSULE | Freq: Every day | ORAL | 1 refills | Status: DC

## 2016-03-31 MED ORDER — AMANTADINE HCL 100 MG PO CAPS: 100.0000 mg | ORAL_CAPSULE | Freq: Every day | ORAL | 1 refills | Status: DC

## 2016-03-31 MED ORDER — ESCITALOPRAM OXALATE 10 MG PO TABS: 10.0000 mg | ORAL_TABLET | Freq: Every day | ORAL | 1 refills | Status: DC

## 2016-03-31 MED ORDER — TRAZODONE HCL 150 MG PO TABS: 150.0000 mg | ORAL_TABLET | Freq: Every day | ORAL | 1 refills | Status: DC

## 2016-03-31 MED ORDER — BUPROPION HCL 75 MG PO TABS: 75.0000 mg | ORAL_TABLET | Freq: Every day | ORAL | 1 refills | Status: DC

## 2016-03-31 NOTE — Progress Notes (Signed)
Psychiatric MD Progress Note   Patient Identification: Taylor SpurrMaria Fleming MRN:  [id number] Date of Evaluation:  03/31/2016 [facility]  Chief Complaint:   Chief Complaint    Follow-up     Visit Diagnosis:    ICD-9-CM ICD-10-CM   1. Schizoaffective disorder, bipolar type (HCC) 295.70 F25.0   2. GAD (generalized anxiety disorder) 300.02 F41.1     History of Present Illness:    Patient is a 35 year old female who was seen for follow-up. She reported that she Had her hysterectomy done on Friday due to heavy bleeding. She is currently taking pain medications. Patient reported that she is compliant with her medications. She has moved in with her friend and her roommate and is taking care of them. Patient stated that the medications are helping her. She is sleeping well with the help of trazodone and is taking half a pill at night. She is also going to start physical therapy for her pelvic region due to the previous history of trauma. It will  started in December. Patient currently denied having any perceptual disturbances. She spends most of the time at home doing household chores. We discussed about the medications and she wants to go higher on the dose of Lexapro at this time due to worsening of her anxiety and has been taking it in the morning.  She is receptive to her medications adjustment. She appeared calm and alert during the interview. She denied having any suicidal ideations or plans. She denied having any perceptual disturbances   Associated Signs/Symptoms: Depression Symptoms:  fatigue, anxiety, loss of energy/fatigue, disturbed sleep, (Hypo) Manic Symptoms:  Distractibility, Flight of Ideas, Impulsivity, Irritable Mood, Labiality of Mood, Anxiety Symptoms:  Excessive Worry, Psychotic Symptoms:  denied PTSD Symptoms: Negative NA  Past Psychiatric History:  She described her by psychiatric history as follows Age [id number]- 15 - 7 times, suicidal  attemts, cutting wrists, OD, running in front of vehicle Age17 -  Suicide attempt  Age 35- suicidal thoughts Age [id number]- 34- twice just going to ER Does not know the names of hospitals. [place] [facility]   Previous Psychotropic Medications: Latuda Lithium- h/o Li  shock Seroquel lamictal- shingles   Substance Abuse History in the last 12 months:  No.  Consequences of Substance Abuse: Negative NA  Past Medical History:  Past Medical History:  Diagnosis Date  . Anemia   . Bipolar 1 disorder (HCC)   . Depression   . History of kidney stones   . Schizophrenia [facility](HCC)     Past Surgical History:  Procedure Laterality Date  . CESAREAN SECTION    . CHOLECYSTECTOMY    . DIAGNOSTIC LAPAROSCOPY    . LAPAROSCOPIC BILATERAL SALPINGECTOMY Bilateral 03/25/2016   Procedure: LAPAROSCOPIC BILATERAL SALPINGECTOMY;  Surgeon: Christeen DouglasBethany Beasley, MD;  Location: [facility] ORS;  Service: Gynecology;  Laterality: Bilateral;  . LAPAROSCOPIC HYSTERECTOMY N/A 03/25/2016   Procedure: HYSTERECTOMY TOTAL LAPAROSCOPIC;  Surgeon: Christeen DouglasBethany Beasley, MD;  Location: [facility] ORS;  Service: Gynecology;  Laterality: N/A;    Family Psychiatric History:  Uncle is schizophrenic and aunt bipolar   Family History:  Family History  Problem Relation Age of Onset  . Alcohol abuse Mother   . Drug abuse Mother   . Drug abuse Father   . Alcohol abuse Father   . Alcohol abuse Sister   . Drug abuse Sister     Social History:   Social History   Social History  . Marital  status: Legally Separated    Spouse name: N/A  . Number of children: N/A  . Years of education: N/A   Social History Main Topics  . Smoking status: Current Every Day Smoker    Packs/day: 1.00    Types: Cigarettes    Start date: 10/12/1992  . Smokeless tobacco: Never Used  . Alcohol use No  . Drug use: No  . Sexual activity: Not Currently   Other Topics Concern  . Not on file   Social History Narrative  . No  narrative on file    Additional Social History:   Lives  With aunt, uncle, son  and cousin Married x 2  Legally married- seperated now.   Allergies:   Allergies  Allergen Reactions  . Ibuprofen Anaphylaxis and Rash  . Penicillin G Swelling    Blisters in throat Has patient had a PCN reaction causing immediate rash, facial/tongue/throat swelling, SOB or lightheadedness with hypotension: Yes Has patient had a PCN reaction causing severe rash involving mucus membranes or skin necrosis: No Has patient had a PCN reaction that required hospitalization Yes Has patient had a PCN reaction occurring within the last 10 years: Yes If all of the above answers are "NO", then may proceed with Cephalosporin use.   . Latex Rash    burning    Metabolic Disorder Labs: Lab Results  Component Value Date   HGBA1C 4.6 12/16/2015   Lab Results  Component Value Date   PROLACTIN 27.5 (H) 12/16/2015   Lab Results  Component Value Date   CHOL 170 12/16/2015   TRIG 90 12/16/2015   HDL 41 12/16/2015   CHOLHDL 4.1 12/16/2015   VLDL 18 12/16/2015   LDLCALC 111 (H) 12/16/2015     Current Medications: Current Outpatient Prescriptions  Medication Sig Dispense Refill  . amantadine (SYMMETREL) 100 MG capsule Take 1 capsule (100 mg total) by mouth at bedtime. 30 capsule 1  . buPROPion (WELLBUTRIN) 75 MG tablet Take 1 tablet (75 mg total) by mouth daily after breakfast. 30 tablet 1  . cyclobenzaprine (FLEXERIL) 10 MG tablet Take 1 tablet (10 mg total) by mouth 3 (three) times daily as needed for muscle spasms. 30 tablet 2  . escitalopram (LEXAPRO) 5 MG tablet Take 1 tablet (5 mg total) by mouth daily. 30 tablet 1  . gabapentin (NEURONTIN) 800 MG tablet Take 1 tablet (800 mg total) by mouth at bedtime. 3 tablet 0  . linaclotide (LINZESS) 145 MCG CAPS capsule Take 145 mcg by mouth daily.     Marland Kitchen. oxycodone (OXY-IR) 5 MG capsule Take 1 capsule (5 mg total) by mouth every 6 (six) hours as needed for pain. 30  capsule 0  . traZODone (DESYREL) 150 MG tablet Take 1 tablet (150 mg total) by mouth at bedtime. 30 tablet 1  . ziprasidone (GEODON) 40 MG capsule Take 1 capsule (40 mg total) by mouth daily with supper. 30 capsule 1   No current facility-administered medications for this visit.     Neurologic: Headache: No Seizure: No Paresthesias:No  Musculoskeletal: Strength & Muscle Tone: within normal limits Gait & Station: normal Patient leans: N/A  Psychiatric Specialty Exam: ROS  Last menstrual period 02/29/2016.There is no height or weight on file to calculate BMI.  General Appearance: Casual  Eye Contact:  Fair  Speech:  Clear and Coherent  Volume:  Normal  Mood:  Anxious  Affect:  Congruent  Thought Process:  Coherent  Orientation:  Full (Time, Place, and Person)  Thought Content:  WDL  Suicidal Thoughts:  No  Homicidal Thoughts:  No  Memory:  Immediate;   Fair  Judgement:  Impaired  Insight:  Shallow  Psychomotor Activity:  Normal  Concentration:  Concentration: Fair and Attention Span: Fair  Recall:  [employer] of Knowledge:Fair  Language: Fair  Akathisia:  No  Handed:  Right  AIMS (if indicated):    Assets:  Communication Skills Desire for Improvement Physical Health Social Support Transportation  ADL's:  Intact  Cognition: WNL  Sleep:  4 hours    Treatment Plan Summary: Medication management  Continue medications as prescribed.  I will start her on Lexapro 10 mg daily.  She will continue on Geodon 40 mg of mood stabilization  She will continue on bupropion in the morning  She will continue on trazodone 150 mg- 1/2  by mouth daily at bedtime for insomnia   Follow-up in 2 months or earlier depending on her symptoms       More than 50% of the time spent in psychoeducation, counseling and coordination of care.    This note was generated in part or whole with voice recognition software. Voice regonition is usually quite accurate but there are transcription  errors that can and very often do occur. I apologize for any typographical errors that were not detected and corrected.    Brandy Hale, MD 11/9/20172:35 PM

## 2016-04-03 ENCOUNTER — Other Ambulatory Visit: Payer: Self-pay | Admitting: Psychiatry

## 2016-04-05 ENCOUNTER — Encounter: Payer: Self-pay | Admitting: Physical Therapy

## 2016-04-19 ENCOUNTER — Encounter: Payer: Self-pay | Admitting: Physical Therapy

## 2016-04-27 ENCOUNTER — Ambulatory Visit: Payer: Medicare Other | Admitting: Physical Therapy

## 2016-05-03 ENCOUNTER — Telehealth: Payer: Self-pay

## 2016-05-03 NOTE — Telephone Encounter (Signed)
received a request for a 90 day supply for the trazodone 150mg  , escitalopram 5mg  and bupropion 75mg 

## 2016-05-03 NOTE — Telephone Encounter (Signed)
left message ok to refill medication for 90 day supply pt was last seen on  03-31-16 next appt 05-31-16

## 2016-05-11 ENCOUNTER — Ambulatory Visit: Payer: Medicare Other | Admitting: Physical Therapy

## 2016-05-24 ENCOUNTER — Encounter: Payer: Self-pay | Admitting: Physical Therapy

## 2016-05-31 ENCOUNTER — Encounter: Payer: Self-pay | Admitting: Physical Therapy

## 2016-05-31 ENCOUNTER — Ambulatory Visit: Payer: Medicare Other | Admitting: Psychiatry

## 2016-06-08 ENCOUNTER — Encounter: Payer: Self-pay | Admitting: Physical Therapy

## 2016-06-22 ENCOUNTER — Other Ambulatory Visit: Payer: Self-pay

## 2016-06-22 NOTE — Telephone Encounter (Signed)
faxed received wanting a 90 day supply for medication refill. pt has appt next month.

## 2016-06-28 ENCOUNTER — Other Ambulatory Visit: Payer: Self-pay | Admitting: Psychiatry

## 2016-06-28 ENCOUNTER — Encounter: Payer: Self-pay | Admitting: Physical Therapy

## 2016-06-29 MED ORDER — TRAZODONE HCL 150 MG PO TABS: 150.0000 mg | ORAL_TABLET | Freq: Every day | ORAL | 0 refills | Status: DC

## 2016-06-29 MED ORDER — BUPROPION HCL 75 MG PO TABS: 75.0000 mg | ORAL_TABLET | Freq: Every day | ORAL | 0 refills | Status: DC

## 2016-06-29 MED ORDER — ZIPRASIDONE HCL 40 MG PO CAPS: 40.0000 mg | ORAL_CAPSULE | Freq: Every day | ORAL | 0 refills | Status: DC

## 2016-06-29 MED ORDER — AMANTADINE HCL 100 MG PO CAPS: 100.0000 mg | ORAL_CAPSULE | Freq: Every day | ORAL | 0 refills | Status: DC

## 2016-06-29 MED ORDER — ESCITALOPRAM OXALATE 5 MG PO TABS: 5.0000 mg | ORAL_TABLET | Freq: Every day | ORAL | 0 refills | Status: DC

## 2016-06-29 MED ORDER — ESCITALOPRAM OXALATE 10 MG PO TABS: 10.0000 mg | ORAL_TABLET | Freq: Every day | ORAL | 0 refills | Status: DC

## 2016-07-04 ENCOUNTER — Ambulatory Visit (INDEPENDENT_AMBULATORY_CARE_PROVIDER_SITE_OTHER): Payer: 59 | Admitting: Psychiatry

## 2016-07-04 DIAGNOSIS — F250 Schizoaffective disorder, bipolar type: Secondary | ICD-10-CM

## 2016-07-04 DIAGNOSIS — F411 Generalized anxiety disorder: Secondary | ICD-10-CM

## 2016-07-04 MED ORDER — LITHIUM CARBONATE ER 300 MG PO TBCR: 300.0000 mg | EXTENDED_RELEASE_TABLET | Freq: Every day | ORAL | 1 refills | Status: DC

## 2016-07-04 NOTE — Progress Notes (Signed)
Psychiatric MD Progress Note   Patient Identification: Taylor Fleming MRN:  [id number] Date of Evaluation:  07/04/2016 Referral Source: [facility]  Chief Complaint:    Visit Diagnosis:    ICD-9-CM ICD-10-CM   1. Schizoaffective disorder, bipolar type (HCC) 295.70 F25.0   2. GAD (generalized anxiety disorder) 300.02 F41.1     History of Present Illness:    Patient is a 36 year old female who was seen for follow-up. She Presented with her live-in girlfriend. She reported that she has been manic since October. She reported that she continues to have mood swings and has been feeling manic. She reported that she is having anxiety and had the hysterectomy done in October but her OB is not starting her on the estrogen as she is a smoker. Patient reported that she is not able to sleep well. Her roommate is trying to help her with the medications as patient has been noncompliant with the medications. We discussed about her medications. Patient reported that she feels agitated. She reported that she is willing to have her medications adjusted at this time. She currently denied having any suicidal ideations or plans. She has recently refilled her medications.   She is receptive to her medications adjustment. She appeared calm and alert during the interview. She denied having any suicidal ideations or plans. She denied having any perceptual disturbances   Associated Signs/Symptoms: Depression Symptoms:  fatigue, anxiety, loss of energy/fatigue, disturbed sleep, (Hypo) Manic Symptoms:  Distractibility, Flight of Ideas, Impulsivity, Irritable Mood, Labiality of Mood, Anxiety Symptoms:  Excessive Worry, Psychotic Symptoms:  denied PTSD Symptoms: Negative NA  Past Psychiatric History:  She described her by psychiatric history as follows Age 55- 83 - 7 times, suicidal attemts, cutting wrists, OD, running in front of vehicle Age17 -  Suicide attempt  Age 74- suicidal  thoughts Age 61- 19- twice just going to ER Does not know the names of hospitals. Lived in [place], [place], [place] and here Was in [facility] and [facility]   Previous Psychotropic Medications: Latuda Lithium- h/o Li  shock Seroquel lamictal- shingles   Substance Abuse History in the last 12 months:  No.  Consequences of Substance Abuse: Negative NA  Past Medical History:  Past Medical History:  Diagnosis Date  . Anemia   . Bipolar 1 disorder (HCC)   . Depression   . History of kidney stones   . Schizophrenia [facility])     Past Surgical History:  Procedure Laterality Date  . CESAREAN SECTION    . CHOLECYSTECTOMY    . DIAGNOSTIC LAPAROSCOPY    . LAPAROSCOPIC BILATERAL SALPINGECTOMY Bilateral 03/25/2016   Procedure: LAPAROSCOPIC BILATERAL SALPINGECTOMY;  Surgeon: Christeen Douglas, MD;  Location: [facility] ORS;  Service: Gynecology;  Laterality: Bilateral;  . LAPAROSCOPIC HYSTERECTOMY N/A 03/25/2016   Procedure: HYSTERECTOMY TOTAL LAPAROSCOPIC;  Surgeon: Christeen Douglas, MD;  Location: [facility] ORS;  Service: Gynecology;  Laterality: N/A;    Family Psychiatric History:  Uncle is schizophrenic and aunt bipolar   Family History:  Family History  Problem Relation Age of Onset  . Alcohol abuse Mother   . Drug abuse Mother   . Drug abuse Father   . Alcohol abuse Father   . Alcohol abuse Sister   . Drug abuse Sister     Social History:   Social History   Social History  . Marital status: Legally Separated    Spouse name: N/A  . Number of children: N/A  . Years of education: N/A   Social History Main Topics  .  Smoking status: Current Every Day Smoker    Packs/day: 1.50    Years: 24.00    Types: Cigarettes    Start date: 10/12/1992  . Smokeless tobacco: Never Used  . Alcohol use No  . Drug use: No  . Sexual activity: Yes    Partners: Female    Birth control/ protection: Surgical   Other Topics Concern  . Not on file   Social History Narrative  . No narrative on file    Additional Social  History:   Lives  With aunt, uncle, son  and cousin Married x 2  Legally married- seperated now.   Allergies:   Allergies  Allergen Reactions  . Ibuprofen Anaphylaxis and Rash  . Penicillin G Swelling    Blisters in throat Has patient had a PCN reaction causing immediate rash, facial/tongue/throat swelling, SOB or lightheadedness with hypotension: Yes Has patient had a PCN reaction causing severe rash involving mucus membranes or skin necrosis: No Has patient had a PCN reaction that required hospitalization Yes Has patient had a PCN reaction occurring within the last 10 years: Yes If all of the above answers are "NO", then may proceed with Cephalosporin use.   . Latex Rash    burning    Metabolic Disorder Labs: Lab Results  Component Value Date   HGBA1C 4.6 12/16/2015   Lab Results  Component Value Date   PROLACTIN 27.5 (H) 12/16/2015   Lab Results  Component Value Date   CHOL 170 12/16/2015   TRIG 90 12/16/2015   HDL 41 12/16/2015   CHOLHDL 4.1 12/16/2015   VLDL 18 12/16/2015   LDLCALC 111 (H) 12/16/2015     Current Medications: Current Outpatient Prescriptions  Medication Sig Dispense Refill  . amantadine (SYMMETREL) 100 MG capsule Take 1 capsule (100 mg total) by mouth at bedtime. 90 capsule 0  . amantadine (SYMMETREL) 100 MG capsule TAKE 1 CAPSULE (100 MG TOTAL) BY MOUTH AT BEDTIME. 30 capsule 1  . buPROPion (WELLBUTRIN) 75 MG tablet Take 1 tablet (75 mg total) by mouth daily after breakfast. 90 tablet 0  . cyclobenzaprine (FLEXERIL) 10 MG tablet Take 1 tablet (10 mg total) by mouth 3 (three) times daily as needed for muscle spasms. 30 tablet 2  . escitalopram (LEXAPRO) 10 MG tablet Take 1 tablet (10 mg total) by mouth daily. 90 tablet 0  . escitalopram (LEXAPRO) 10 MG tablet TAKE 1 TABLET (10 MG TOTAL) BY MOUTH DAILY. 30 tablet 1  . escitalopram (LEXAPRO) 5 MG tablet Take 1 tablet (5 mg total) by mouth daily. 90 tablet 0  . linaclotide (LINZESS) 145 MCG CAPS  capsule Take 145 mcg by mouth daily.     Marland Kitchen oxycodone (OXY-IR) 5 MG capsule Take 1 capsule (5 mg total) by mouth every 6 (six) hours as needed for pain. 30 capsule 0  . traZODone (DESYREL) 150 MG tablet Take 1 tablet (150 mg total) by mouth at bedtime. 90 tablet 0  . ziprasidone (GEODON) 40 MG capsule Take 1 capsule (40 mg total) by mouth daily with supper. 90 capsule 0  . ziprasidone (GEODON) 40 MG capsule TAKE 1 CAPSULE (40 MG TOTAL) BY MOUTH DAILY WITH SUPPER. 30 capsule 1   No current facility-administered medications for this visit.     Neurologic: Headache: No Seizure: No Paresthesias:No  Musculoskeletal: Strength & Muscle Tone: within normal limits Gait & Station: normal Patient leans: N/A  Psychiatric Specialty Exam: ROS  Last menstrual period 02/29/2016.There is no height or weight on file to  calculate BMI.  General Appearance: Casual  Eye Contact:  Fair  Speech:  Clear and Coherent  Volume:  Normal  Mood:  Anxious  Affect:  Congruent  Thought Process:  Coherent  Orientation:  Full (Time, Place, and Person)  Thought Content:  WDL  Suicidal Thoughts:  No  Homicidal Thoughts:  No  Memory:  Immediate;   Fair  Judgement:  Impaired  Insight:  Shallow  Psychomotor Activity:  Normal  Concentration:  Concentration: Fair and Attention Span: Fair  Recall:  [employer]  Fund of Knowledge:Fair  Language: Fair  Akathisia:  No  Handed:  Right  AIMS (if indicated):    Assets:  Communication Skills Desire for Improvement Physical Health Social Support Transportation  ADL's:  Intact  Cognition: WNL  Sleep:  4 hours    Treatment Plan Summary: Medication management  Continue medications as prescribed.  Continue Lexapro 10 mg daily.  She will continue on Geodon 40 mg of mood stabilization she also takes amantadine 100 mg daily to prevent the side effects of the Geodon. Discontinue bupropion to prevent the mood swings. We'll start her on lithium carbonate 300 mg in the morning  to help with the mood swings. 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 She will continue on trazodone 150 mg- 1/2  by mouth daily at bedtime for insomnia. Roommate will help with the pillbox and she agreed with the plan.  Follow-up in 1 months or earlier depending on her symptoms       More than 50% of the time spent in psychoeducation, counseling and coordination of care.    This note was generated in part or whole with voice recognition software. Voice regonition is usually quite accurate but there are transcription errors that can and very often do occur. I apologize for any typographical errors that were not detected and corrected.    Brandy HaleUzma Wah Sabic, MD 2/12/201812:26 PM

## 2016-07-06 ENCOUNTER — Telehealth: Payer: Self-pay

## 2016-07-06 NOTE — Telephone Encounter (Signed)
Ask her to stop the Lithium

## 2016-07-06 NOTE — Telephone Encounter (Signed)
pt called states that she seen yesterday and that you put her on lithium.  she states that she feel high, she go no sleep and she is very hyper.  pt wants to know if she should continue.

## 2016-07-07 NOTE — Telephone Encounter (Signed)
spoke with patient gave the instruction per dr. Garnetta Buddyfaheem to stop the lithium.  pt states she feel like on a high takin so she was told to stop.  pt was transfered to front desk to make appt .

## 2016-07-20 ENCOUNTER — Encounter: Payer: Self-pay | Admitting: Physical Therapy

## 2016-07-25 ENCOUNTER — Telehealth: Payer: Self-pay

## 2016-07-25 NOTE — Telephone Encounter (Signed)
PT CALLED LEFT MESSAGE THAT THE PHARMACY TOLD HER THAT THE GEODON IS NOT BEING MADE  ANYMORE AT THIS POINT AND THAT THEY ARE WAITING ON WHEN IT IS GOING TO BE IN PRODUCTION AGAIN.  IS THERE SOMETHING ELSE SHE CAN TAKE TO REPLACE UNTIL GEODON IS BEING MADE AGAIN

## 2016-07-25 NOTE — Telephone Encounter (Signed)
Has she checked any other pharmmacy? We have not received this notice. Advise pt to check around pharmacies if they have the supply.

## 2016-08-01 ENCOUNTER — Encounter: Payer: Self-pay | Admitting: Psychiatry

## 2016-08-01 ENCOUNTER — Ambulatory Visit (INDEPENDENT_AMBULATORY_CARE_PROVIDER_SITE_OTHER): Payer: 59 | Admitting: Psychiatry

## 2016-08-01 VITALS — BP 99/64 | HR 78 | Temp 98.3°F | Wt 169.2 lb

## 2016-08-01 DIAGNOSIS — F250 Schizoaffective disorder, bipolar type: Secondary | ICD-10-CM

## 2016-08-01 DIAGNOSIS — F411 Generalized anxiety disorder: Secondary | ICD-10-CM

## 2016-08-01 MED ORDER — ESCITALOPRAM OXALATE 10 MG PO TABS: 15.0000 mg | ORAL_TABLET | Freq: Every day | ORAL | 1 refills | Status: DC

## 2016-08-01 NOTE — Progress Notes (Signed)
Psychiatric MD Progress Note   Patient Identification: Taylor Fleming MRN:  [id number] Date of Evaluation:  08/01/2016 Referral Source: [facility]  Chief Complaint:   Chief Complaint    Medication Refill     Visit Diagnosis:    ICD-9-CM ICD-10-CM   1. Schizoaffective disorder, bipolar type (HCC) 295.70 F25.0   2. GAD (generalized anxiety disorder) 300.02 F41.1     History of Present Illness:    Patient is a 36 year old female who was seen for follow-up. She resented with her live-in girlfriend. She reported that she Did not respond well to the lithium and became manic. Her girlfriend also reported that she has been doing better on the Geodon and they were able to get a refill on the Geodon. Patient reported that she drinks [facility] and smoke cigarettes throughout the day. She has worsening of her anxiety symptoms. We discussed about the adverse effects of the [place] at the caffeine. She reported that she has anxiety symptoms. Advised her to decrease the use of [facility] and cigarettes at this time. She agreed to a higher dose of Lexapro at this time. She currently denied having any suicidal homicidal ideations or plans. She appeared calm and alert during the interview.      She is receptive to her medications adjustment. She appeared calm and alert during the interview. She denied having any suicidal ideations or plans. She denied having any perceptual disturbances   Associated Signs/Symptoms: Depression Symptoms:  fatigue, anxiety, loss of energy/fatigue, disturbed sleep, (Hypo) Manic Symptoms:  Distractibility, Flight of Ideas, Impulsivity, Irritable Mood, Labiality of Mood, Anxiety Symptoms:  Excessive Worry, Psychotic Symptoms:  denied PTSD Symptoms: Negative NA  Past Psychiatric History:  She described her by psychiatric history as follows Age 73- 63 - 7 times, suicidal attemts, cutting wrists, OD, running in front of vehicle Age17 -  Suicide  attempt  Age 6- suicidal thoughts Age 1- 58- twice just going to ER Does not know the names of hospitals. Lived in [place], [place], [place] and here Was in [facility] and [facility]   Previous Psychotropic Medications: Latuda Lithium- h/o Li  shock Seroquel lamictal- shingles   Substance Abuse History in the last 12 months:  No.  Consequences of Substance Abuse: Negative NA  Past Medical History:  Past Medical History:  Diagnosis Date  . Anemia   . Bipolar 1 disorder (HCC)   . Depression   . History of kidney stones   . Schizophrenia [facility])     Past Surgical History:  Procedure Laterality Date  . CESAREAN SECTION    . CHOLECYSTECTOMY    . DIAGNOSTIC LAPAROSCOPY    . LAPAROSCOPIC BILATERAL SALPINGECTOMY Bilateral 03/25/2016   Procedure: LAPAROSCOPIC BILATERAL SALPINGECTOMY;  Surgeon: Christeen Douglas, MD;  Location: [facility] ORS;  Service: Gynecology;  Laterality: Bilateral;  . LAPAROSCOPIC HYSTERECTOMY N/A 03/25/2016   Procedure: HYSTERECTOMY TOTAL LAPAROSCOPIC;  Surgeon: Christeen Douglas, MD;  Location: [facility] ORS;  Service: Gynecology;  Laterality: N/A;    Family Psychiatric History:  Uncle is schizophrenic and aunt bipolar   Family History:  Family History  Problem Relation Age of Onset  . Alcohol abuse Mother   . Drug abuse Mother   . Drug abuse Father   . Alcohol abuse Father   . Alcohol abuse Sister   . Drug abuse Sister     Social History:   Social History   Social History  . Marital status: Legally Separated    Spouse name: N/A  . Number of children: N/A  .  Years of education: N/A   Social History Main Topics  . Smoking status: Current Every Day Smoker    Packs/day: 1.50    Years: 24.00    Types: Cigarettes    Start date: 10/12/1992  . Smokeless tobacco: Never Used  . Alcohol use No  . Drug use: No  . Sexual activity: Yes    Partners: Female    Birth control/ protection: Surgical   Other Topics Concern  . None   Social History Narrative  . None    Additional  Social History:   Lives  With aunt, uncle, son  and cousin Married x 2  Legally married- seperated now.   Allergies:   Allergies  Allergen Reactions  . Ibuprofen Anaphylaxis and Rash  . Penicillin G Swelling    Blisters in throat Has patient had a PCN reaction causing immediate rash, facial/tongue/throat swelling, SOB or lightheadedness with hypotension: Yes Has patient had a PCN reaction causing severe rash involving mucus membranes or skin necrosis: No Has patient had a PCN reaction that required hospitalization Yes Has patient had a PCN reaction occurring within the last 10 years: Yes If all of the above answers are "NO", then may proceed with Cephalosporin use.   . Latex Rash    burning    Metabolic Disorder Labs: Lab Results  Component Value Date   HGBA1C 4.6 12/16/2015   Lab Results  Component Value Date   PROLACTIN 27.5 (H) 12/16/2015   Lab Results  Component Value Date   CHOL 170 12/16/2015   TRIG 90 12/16/2015   HDL 41 12/16/2015   CHOLHDL 4.1 12/16/2015   VLDL 18 12/16/2015   LDLCALC 111 (H) 12/16/2015     Current Medications: Current Outpatient Prescriptions  Medication Sig Dispense Refill  . amantadine (SYMMETREL) 100 MG capsule Take 1 capsule (100 mg total) by mouth at bedtime. 90 capsule 0  . cyclobenzaprine (FLEXERIL) 10 MG tablet Take 1 tablet (10 mg total) by mouth 3 (three) times daily as needed for muscle spasms. 30 tablet 2  . escitalopram (LEXAPRO) 10 MG tablet Take 1.5 tablets (15 mg total) by mouth daily. 135 tablet 1  . linaclotide (LINZESS) 145 MCG CAPS capsule Take 145 mcg by mouth daily.     Marland Kitchen oxycodone (OXY-IR) 5 MG capsule Take 1 capsule (5 mg total) by mouth every 6 (six) hours as needed for pain. 30 capsule 0  . traZODone (DESYREL) 150 MG tablet Take 1 tablet (150 mg total) by mouth at bedtime. 90 tablet 0  . ziprasidone (GEODON) 40 MG capsule Take 1 capsule (40 mg total) by mouth daily with supper. 90 capsule 0   No current  facility-administered medications for this visit.     Neurologic: Headache: No Seizure: No Paresthesias:No  Musculoskeletal: Strength & Muscle Tone: within normal limits Gait & Station: normal Patient leans: N/A  Psychiatric Specialty Exam: ROS  Blood pressure 99/64, pulse 78, temperature 98.3 F (36.8 C), temperature source Oral, weight 169 lb 3.2 oz (76.7 kg), last menstrual period 02/29/2016.Body mass index is 31.71 kg/m.  General Appearance: Casual  Eye Contact:  Fair  Speech:  Clear and Coherent  Volume:  Normal  Mood:  Anxious  Affect:  Congruent  Thought Process:  Coherent  Orientation:  Full (Time, Place, and Person)  Thought Content:  WDL  Suicidal Thoughts:  No  Homicidal Thoughts:  No  Memory:  Immediate;   Fair  Judgement:  Impaired  Insight:  Shallow  Psychomotor Activity:  Normal  Concentration:  Concentration: Fair and Attention Span: Fair  Recall:  [employer]  Fund of Knowledge:Fair  Language: Fair  Akathisia:  No  Handed:  Right  AIMS (if indicated):    Assets:  Communication Skills Desire for Improvement Physical Health Social Support Transportation  ADL's:  Intact  Cognition: WNL  Sleep:  4 hours    Treatment Plan Summary: Medication management  Continue medications as prescribed.  Increase Lexapro 15 mg by mouth daily She will continue on Geodon 40 mg of mood stabilization she also takes amantadine 100 mg daily to prevent the side effects of the Geodon. She will continue on trazodone 150 mg- 1/2  by mouth daily at bedtime for insomnia. Roommate will help with the pillbox and she agreed with the plan.  Follow-up in 1 months or earlier depending on her symptoms       More than 50% of the time spent in psychoeducation, counseling and coordination of care.    This note was generated in part or whole with voice recognition software. Voice regonition is usually quite accurate but there are transcription errors that can and very often do occur.  I apologize for any typographical errors that were not detected and corrected.    Brandy HaleUzma Silvana Holecek, MD 3/12/20189:52 AM

## 2016-08-03 ENCOUNTER — Encounter: Payer: Self-pay | Admitting: Physical Therapy

## 2016-08-17 ENCOUNTER — Ambulatory Visit (INDEPENDENT_AMBULATORY_CARE_PROVIDER_SITE_OTHER): Payer: 59 | Admitting: Psychiatry

## 2016-08-17 ENCOUNTER — Encounter: Payer: Self-pay | Admitting: Psychiatry

## 2016-08-17 VITALS — BP 126/79 | HR 77 | Temp 98.6°F | Wt 169.4 lb

## 2016-08-17 DIAGNOSIS — F411 Generalized anxiety disorder: Secondary | ICD-10-CM

## 2016-08-17 DIAGNOSIS — F250 Schizoaffective disorder, bipolar type: Secondary | ICD-10-CM

## 2016-08-17 MED ORDER — BUSPIRONE HCL 10 MG PO TABS: 10.0000 mg | ORAL_TABLET | Freq: Every morning | ORAL | 1 refills | Status: DC

## 2016-08-17 NOTE — Progress Notes (Signed)
Psychiatric MD Progress Note   Patient Identification: Taylor Fleming MRN:  [id number] Date of Evaluation:  08/17/2016 Referral Source: [facility]  Chief Complaint:    Visit Diagnosis:    ICD-9-CM ICD-10-CM   1. Schizoaffective disorder, bipolar type (HCC) 295.70 F25.0   2. GAD (generalized anxiety disorder) 300.02 F41.1     History of Present Illness:    Patient is a 36 year old female who was seen for follow-up. She presented with her live-in girlfriend. She reported that sheHas been having problems with her live-in friend who will call her crazy and will yell at her. She reported that she has been helping him as he is 300 pounds and is handicapped. She reported that she feels agitated and angry when he has been calling her names. She reported that she wants her medications for anxiety. Her girlfriend also reported that she has been trying to explain to her that he is handicapped and cannot control her emotions. She stated that she has stopped using [facility]. We discussed about her medications. Patient wants something for anxiety. Advised patient to start doing therapy to control her emotions and she agreed with the plan. She denied having any suicidal homicidal ideations or plans. She appeared anxious and apprehensive during the interview.     Associated Signs/Symptoms: Depression Symptoms:  fatigue, anxiety, loss of energy/fatigue, disturbed sleep, (Hypo) Manic Symptoms:  Distractibility, Flight of Ideas, Impulsivity, Irritable Mood, Labiality of Mood, Anxiety Symptoms:  Excessive Worry, Psychotic Symptoms:  denied PTSD Symptoms: Negative NA  Past Psychiatric History:  She described her by psychiatric history as follows Age 30- 75 - 7 times, suicidal attemts, cutting wrists, OD, running in front of vehicle Age17 -  Suicide attempt  Age 54- suicidal thoughts Age 44- 68- twice just going to ER Does not know the names of hospitals. Lived in [place], [place], [place] and  here Was in [facility] and [facility]   Previous Psychotropic Medications: Latuda Lithium- h/o Li  shock Seroquel lamictal- shingles   Substance Abuse History in the last 12 months:  No.  Consequences of Substance Abuse: Negative NA  Past Medical History:  Past Medical History:  Diagnosis Date  . Anemia   . Bipolar 1 disorder (HCC)   . Depression   . History of kidney stones   . Schizophrenia [facility])     Past Surgical History:  Procedure Laterality Date  . CESAREAN SECTION    . CHOLECYSTECTOMY    . DIAGNOSTIC LAPAROSCOPY    . LAPAROSCOPIC BILATERAL SALPINGECTOMY Bilateral 03/25/2016   Procedure: LAPAROSCOPIC BILATERAL SALPINGECTOMY;  Surgeon: Christeen Douglas, MD;  Location: [facility] ORS;  Service: Gynecology;  Laterality: Bilateral;  . LAPAROSCOPIC HYSTERECTOMY N/A 03/25/2016   Procedure: HYSTERECTOMY TOTAL LAPAROSCOPIC;  Surgeon: Christeen Douglas, MD;  Location: [facility] ORS;  Service: Gynecology;  Laterality: N/A;    Family Psychiatric History:  Uncle is schizophrenic and aunt bipolar   Family History:  Family History  Problem Relation Age of Onset  . Alcohol abuse Mother   . Drug abuse Mother   . Drug abuse Father   . Alcohol abuse Father   . Alcohol abuse Sister   . Drug abuse Sister     Social History:   Social History   Social History  . Marital status: Legally Separated    Spouse name: N/A  . Number of children: N/A  . Years of education: N/A   Social History Main Topics  . Smoking status: Current Every Day Smoker    Packs/day: 1.50  Years: 24.00    Types: Cigarettes    Start date: 10/12/1992  . Smokeless tobacco: Never Used  . Alcohol use No  . Drug use: No  . Sexual activity: Yes    Partners: Female    Birth control/ protection: Surgical   Other Topics Concern  . Not on file   Social History Narrative  . No narrative on file    Additional Social History:   Lives  With aunt, uncle, son  and cousin Married x 2  Legally married- seperated now.    Allergies:   Allergies  Allergen Reactions  . Ibuprofen Anaphylaxis and Rash  . Penicillin G Swelling    Blisters in throat Has patient had a PCN reaction causing immediate rash, facial/tongue/throat swelling, SOB or lightheadedness with hypotension: Yes Has patient had a PCN reaction causing severe rash involving mucus membranes or skin necrosis: No Has patient had a PCN reaction that required hospitalization Yes Has patient had a PCN reaction occurring within the last 10 years: Yes If all of the above answers are "NO", then may proceed with Cephalosporin use.   . Latex Rash    burning    Metabolic Disorder Labs: Lab Results  Component Value Date   HGBA1C 4.6 12/16/2015   Lab Results  Component Value Date   PROLACTIN 27.5 (H) 12/16/2015   Lab Results  Component Value Date   CHOL 170 12/16/2015   TRIG 90 12/16/2015   HDL 41 12/16/2015   CHOLHDL 4.1 12/16/2015   VLDL 18 12/16/2015   LDLCALC 111 (H) 12/16/2015     Current Medications: Current Outpatient Prescriptions  Medication Sig Dispense Refill  . amantadine (SYMMETREL) 100 MG capsule Take 1 capsule (100 mg total) by mouth at bedtime. 90 capsule 0  . cyclobenzaprine (FLEXERIL) 10 MG tablet Take 1 tablet (10 mg total) by mouth 3 (three) times daily as needed for muscle spasms. 30 tablet 2  . escitalopram (LEXAPRO) 10 MG tablet Take 1.5 tablets (15 mg total) by mouth daily. 135 tablet 1  . linaclotide (LINZESS) 145 MCG CAPS capsule Take 145 mcg by mouth daily.     Marland Kitchen. oxycodone (OXY-IR) 5 MG capsule Take 1 capsule (5 mg total) by mouth every 6 (six) hours as needed for pain. 30 capsule 0  . traZODone (DESYREL) 150 MG tablet Take 1 tablet (150 mg total) by mouth at bedtime. 90 tablet 0  . ziprasidone (GEODON) 40 MG capsule Take 1 capsule (40 mg total) by mouth daily with supper. 90 capsule 0   No current facility-administered medications for this visit.     Neurologic: Headache: No Seizure:  No Paresthesias:No  Musculoskeletal: Strength & Muscle Tone: within normal limits Gait & Station: normal Patient leans: N/A  Psychiatric Specialty Exam: ROS  Last menstrual period 02/29/2016.There is no height or weight on file to calculate BMI.  General Appearance: Casual  Eye Contact:  Fair  Speech:  Clear and Coherent  Volume:  Normal  Mood:  Anxious  Affect:  Congruent  Thought Process:  Coherent  Orientation:  Full (Time, Place, and Person)  Thought Content:  WDL  Suicidal Thoughts:  No  Homicidal Thoughts:  No  Memory:  Immediate;   Fair  Judgement:  Impaired  Insight:  Shallow  Psychomotor Activity:  Normal  Concentration:  Concentration: Fair and Attention Span: Fair  Recall:  [employer]  Fund of Knowledge:Fair  Language: Fair  Akathisia:  No  Handed:  Right  AIMS (if indicated):    Assets:  Communication Skills Desire for Improvement Physical Health Social Support Transportation  ADL's:  Intact  Cognition: WNL  Sleep:  4 hours    Treatment Plan Summary: Medication management  Continue medications as prescribed.  I will start her on BuSpar to help with her anxiety symptoms. She has refills on the other medications at this time. Increase Lexapro 15 mg by mouth daily She will continue on Geodon 40 mg of mood stabilization she also takes amantadine 100 mg daily to prevent the side effects of the Geodon. She will continue on trazodone 150 mg- 1/2  by mouth daily at bedtime for insomnia. Roommate will help with the pillbox and she agreed with the plan.  Follow-up in 1 months or earlier depending on her symptoms       More than 50% of the time spent in psychoeducation, counseling and coordination of care.    This note was generated in part or whole with voice recognition software. Voice regonition is usually quite accurate but there are transcription errors that can and very often do occur. I apologize for any typographical errors that were not detected and  corrected.    Brandy Hale, MD 3/28/201810:57 AM

## 2016-08-19 ENCOUNTER — Other Ambulatory Visit: Payer: Self-pay | Admitting: Psychiatry

## 2016-08-21 ENCOUNTER — Other Ambulatory Visit: Payer: Self-pay | Admitting: Psychiatry

## 2016-09-08 ENCOUNTER — Ambulatory Visit: Payer: Medicare Other | Admitting: Licensed Clinical Social Worker

## 2016-09-13 ENCOUNTER — Ambulatory Visit: Payer: Medicare Other | Admitting: Psychiatry

## 2016-09-14 ENCOUNTER — Encounter: Payer: Self-pay | Admitting: Emergency Medicine

## 2016-09-14 ENCOUNTER — Emergency Department: Payer: Medicare Other

## 2016-09-14 DIAGNOSIS — J680 Bronchitis and pneumonitis due to chemicals, gases, fumes and vapors: Secondary | ICD-10-CM | POA: Diagnosis not present

## 2016-09-14 DIAGNOSIS — T5491XA Toxic effect of unspecified corrosive substance, accidental (unintentional), initial encounter: Secondary | ICD-10-CM | POA: Diagnosis not present

## 2016-09-14 DIAGNOSIS — Z79899 Other long term (current) drug therapy: Secondary | ICD-10-CM | POA: Diagnosis not present

## 2016-09-14 DIAGNOSIS — R0602 Shortness of breath: Secondary | ICD-10-CM | POA: Diagnosis not present

## 2016-09-14 DIAGNOSIS — R05 Cough: Secondary | ICD-10-CM | POA: Insufficient documentation

## 2016-09-14 DIAGNOSIS — R062 Wheezing: Secondary | ICD-10-CM | POA: Insufficient documentation

## 2016-09-14 DIAGNOSIS — R079 Chest pain, unspecified: Secondary | ICD-10-CM | POA: Insufficient documentation

## 2016-09-14 DIAGNOSIS — F17210 Nicotine dependence, cigarettes, uncomplicated: Secondary | ICD-10-CM | POA: Diagnosis not present

## 2016-09-14 LAB — CBC WITH DIFFERENTIAL/PLATELET
BASOS PCT: 1 %
Basophils Absolute: 0.1 10*3/uL (ref 0–0.1)
EOS ABS: 0.2 10*3/uL (ref 0–0.7)
Eosinophils Relative: 2 %
HEMATOCRIT: 38.4 % (ref 35.0–47.0)
Hemoglobin: 13.3 g/dL (ref 12.0–16.0)
Lymphocytes Relative: 34 %
Lymphs Abs: 3.8 10*3/uL — ABNORMAL HIGH (ref 1.0–3.6)
MCH: 30.6 pg (ref 26.0–34.0)
MCHC: 34.5 g/dL (ref 32.0–36.0)
MCV: 88.9 fL (ref 80.0–100.0)
MONOS PCT: 7 %
Monocytes Absolute: 0.7 10*3/uL (ref 0.2–0.9)
Neutro Abs: 6.2 10*3/uL (ref 1.4–6.5)
Neutrophils Relative %: 56 %
Platelets: 255 10*3/uL (ref 150–440)
RBC: 4.32 MIL/uL (ref 3.80–5.20)
RDW: 13.1 % (ref 11.5–14.5)
WBC: 11.1 10*3/uL — ABNORMAL HIGH (ref 3.6–11.0)

## 2016-09-14 LAB — BASIC METABOLIC PANEL
Anion gap: 8 (ref 5–15)
BUN: 15 mg/dL (ref 6–20)
CALCIUM: 9.3 mg/dL (ref 8.9–10.3)
CO2: 25 mmol/L (ref 22–32)
CREATININE: 0.87 mg/dL (ref 0.44–1.00)
Chloride: 104 mmol/L (ref 101–111)
GFR calc non Af Amer: >60 mL/min (ref >60)
Glucose, Bld: 96 mg/dL (ref 65–99)
Potassium: 3.8 mmol/L (ref 3.5–5.1)
Sodium: 137 mmol/L (ref 135–145)

## 2016-09-14 NOTE — ED Triage Notes (Signed)
Pt presents to ED with worsening sob and cough for the past 3 weeks. Pt states she had been using a lot of bleach for the past couple of months and it made her feel sob so she stopped using it 3 days but her sob has continued. Pt reports her "lungs hurt all the time". Pt states she "really thinks it was the bleach".

## 2016-09-15 ENCOUNTER — Emergency Department
Admission: EM | Admit: 2016-09-15 | Discharge: 2016-09-15 | Disposition: A | Discharge status: Home / Self Care | Payer: Medicare Other | Attending: Emergency Medicine | Admitting: Emergency Medicine

## 2016-09-15 DIAGNOSIS — J680 Bronchitis and pneumonitis due to chemicals, gases, fumes and vapors: Secondary | ICD-10-CM

## 2016-09-15 LAB — TROPONIN I: Troponin I: <0.03 ng/mL (ref <0.03)

## 2016-09-15 MED ORDER — IPRATROPIUM-ALBUTEROL 0.5-2.5 (3) MG/3ML IN SOLN
3.0000 mL | Freq: Once | RESPIRATORY_TRACT | Status: AC
  Administered 2016-09-15: 3 mL via RESPIRATORY_TRACT
  Filled 2016-09-15: qty 3

## 2016-09-15 MED ORDER — ALBUTEROL SULFATE HFA 108 (90 BASE) MCG/ACT IN AERS: 2.0000 | INHALATION_SPRAY | Freq: Four times a day (QID) | RESPIRATORY_TRACT | 2 refills | Status: AC | PRN

## 2016-09-15 MED ORDER — ONDANSETRON HCL 4 MG/2ML IJ SOLN
4.0000 mg | Freq: Once | INTRAMUSCULAR | Status: AC
  Administered 2016-09-15: 4 mg via INTRAVENOUS
  Filled 2016-09-15: qty 2

## 2016-09-15 MED ORDER — PREDNISONE 20 MG PO TABS: 60.0000 mg | ORAL_TABLET | Freq: Every day | ORAL | 0 refills | Status: AC

## 2016-09-15 MED ORDER — ACETAMINOPHEN 500 MG PO TABS
1000.0000 mg | ORAL_TABLET | Freq: Once | ORAL | Status: AC
  Administered 2016-09-15: 1000 mg via ORAL
  Filled 2016-09-15: qty 2

## 2016-09-15 MED ORDER — SODIUM CHLORIDE 0.9 % IV BOLUS (SEPSIS)
1000.0000 mL | Freq: Once | INTRAVENOUS | Status: AC
  Administered 2016-09-15: 1000 mL via INTRAVENOUS
  Filled 2016-09-15: qty 1000

## 2016-09-15 NOTE — ED Notes (Signed)
ED Provider at bedside. 

## 2016-09-15 NOTE — ED Provider Notes (Signed)
[facility] Emergency Department Provider Note  ____________________________________________  Time seen: Approximately 3:35 AM  I have reviewed the triage vital signs and the nursing notes.   HISTORY  Chief Complaint Shortness of Breath and Cough   HPI Taylor Fleming is a 36 y.o. female with a history of bipolar, schizophrenia, anemia, smoking who presents for evaluation of cough, wheezing, shortness of breath. Patient reports that she is living with a friend "who has lots of accidents". She has been using a lot of bleach to clean his accidents in the house since they have kids running in the house. Since she moved in and started using the bleach as frequently as she is she's been having wheezing, shortness of breath, and a cough productive of brownish sputum. She also endorses sharp constant pain located in bilateral chest that is worse with deep inspiration and coughing for 3 weeks. Patient denies any prior episodes of wheezing, history of asthma or COPD. She does have a history of heavy smoking. No fever or chills, nausea or vomiting, abdominal pain. She has no personal history of ischemic heart disease. She does have a family history and her mother of heart attacks. She denies personal or family history of blood clots, recent travel or immobilization, leg pain or swelling, exogenous hormones, or hemoptysis.  Past Medical History:  Diagnosis Date  . Anemia   . Bipolar 1 disorder (HCC)   . Depression   . History of kidney stones   . Schizophrenia [facility])     Patient Active Problem List   Diagnosis Date Noted  . S/P total hysterectomy 03/25/2016  . Borderline personality disorder 12/16/2015  . Bipolar disorder (HCC) 12/16/2015  . Tobacco use disorder 12/16/2015  . Headache, migraine 04/02/2015    Past Surgical History:  Procedure Laterality Date  . ABDOMINAL HYSTERECTOMY    . CESAREAN SECTION    . CHOLECYSTECTOMY    . DIAGNOSTIC LAPAROSCOPY    .  LAPAROSCOPIC BILATERAL SALPINGECTOMY Bilateral 03/25/2016   Procedure: LAPAROSCOPIC BILATERAL SALPINGECTOMY;  Surgeon: Christeen Douglas, MD;  Location: [facility] ORS;  Service: Gynecology;  Laterality: Bilateral;  . LAPAROSCOPIC HYSTERECTOMY N/A 03/25/2016   Procedure: HYSTERECTOMY TOTAL LAPAROSCOPIC;  Surgeon: Christeen Douglas, MD;  Location: [facility] ORS;  Service: Gynecology;  Laterality: N/A;    Prior to Admission medications   Medication Sig Start Date End Date Taking? Authorizing Provider  albuterol (PROVENTIL HFA;VENTOLIN HFA) 108 (90 Base) MCG/ACT inhaler Inhale 2 puffs into the lungs every 6 (six) hours as needed for wheezing or shortness of breath. 09/15/16   Nita Sickle, MD  amantadine (SYMMETREL) 100 MG capsule Take 1 capsule (100 mg total) by mouth at bedtime. 06/29/16   Brandy Hale, MD  busPIRone (BUSPAR) 10 MG tablet Take 1 tablet (10 mg total) by mouth every morning. 08/17/16   Brandy Hale, MD  cyclobenzaprine (FLEXERIL) 10 MG tablet Take 1 tablet (10 mg total) by mouth 3 (three) times daily as needed for muscle spasms. 03/26/16   Christeen Douglas, MD  escitalopram (LEXAPRO) 10 MG tablet Take 1.5 tablets (15 mg total) by mouth daily. 08/01/16   Brandy Hale, MD  linaclotide (LINZESS) 145 MCG CAPS capsule Take 145 mcg by mouth daily.     Historical Provider, MD  oxycodone (OXY-IR) 5 MG capsule Take 1 capsule (5 mg total) by mouth every 6 (six) hours as needed for pain. 03/26/16   Christeen Douglas, MD  predniSONE (DELTASONE) 20 MG tablet Take 3 tablets (60 mg total) by mouth daily. 09/15/16 09/19/16  Nita Sickle, MD  traZODone (DESYREL) 150 MG tablet Take 1 tablet (150 mg total) by mouth at bedtime. 06/29/16   Brandy Hale, MD  ziprasidone (GEODON) 40 MG capsule Take 1 capsule (40 mg total) by mouth daily with supper. 06/29/16   Brandy Hale, MD    Allergies Ibuprofen; Penicillin g; Morphine and related; and Latex  Family History  Problem Relation Age of Onset  . Alcohol abuse Mother   . Drug  abuse Mother   . Drug abuse Father   . Alcohol abuse Father   . Alcohol abuse Sister   . Drug abuse Sister     Social History Social History  Substance Use Topics  . Smoking status: Current Every Day Smoker    Packs/day: 1.50    Years: 24.00    Types: Cigarettes    Start date: 10/12/1992  . Smokeless tobacco: Never Used  . Alcohol use No    Review of Systems  Constitutional: Negative for fever. Eyes: Negative for visual changes. ENT: Negative for sore throat. Neck: No neck pain  Cardiovascular: + chest pain. Respiratory: + shortness of breath, wheezing, cough Gastrointestinal: Negative for abdominal pain, vomiting or diarrhea. Genitourinary: Negative for dysuria. Musculoskeletal: Negative for back pain. Skin: Negative for rash. Neurological: Negative for headaches, weakness or numbness. Psych: No SI or HI  ____________________________________________   PHYSICAL EXAM:  VITAL SIGNS: ED Triage Vitals  Enc Vitals Group     BP 09/14/16 2301 (!) 117/96     Pulse Rate 09/14/16 2301 79     Resp 09/14/16 2301 20     Temp 09/14/16 2301 98.8 F (37.1 C)     Temp Source 09/14/16 2301 Oral     SpO2 09/14/16 2301 98 %     Weight 09/14/16 2301 142 lb (64.4 kg)     Height 09/14/16 2301  (1.549 m)     Head Circumference --      Peak Flow --      Pain Score 09/14/16 2300 9     Pain Loc --      Pain Edu? --      Excl. in GC? --     Constitutional: Alert and oriented. Well appearing and in no apparent distress. HEENT:      Head: Normocephalic and atraumatic.         Eyes: Conjunctivae are normal. Sclera is non-icteric. EOMI. PERRL      Mouth/Throat: Mucous membranes are moist.       Neck: Supple with no signs of meningismus. Cardiovascular: Regular rate and rhythm. No murmurs, gallops, or rubs. 2+ symmetrical distal pulses are present in all extremities. No JVD. Respiratory: Normal respiratory effort. Patient actively coughing, Lungs are clear to auscultation  bilaterally with slightly decreased air movement. No wheezes, crackles, or rhonchi.  Gastrointestinal: Soft, non tender, and non distended with positive bowel sounds. No rebound or guarding. Genitourinary: No CVA tenderness. Musculoskeletal: Nontender with normal range of motion in all extremities. No edema, cyanosis, or erythema of extremities. Neurologic: Normal speech and language. Face is symmetric. Moving all extremities. No gross focal neurologic deficits are appreciated. Skin: Skin is warm, dry and intact. No rash noted. Psychiatric: Mood and affect are normal. Speech and behavior are normal.  ____________________________________________   LABS (all labs ordered are listed, but only abnormal results are displayed)  Labs Reviewed  CBC WITH DIFFERENTIAL/PLATELET - Abnormal; Notable for the following:       Result Value   WBC 11.1 (*)  Lymphs Abs 3.8 (*)    All other components within normal limits  BASIC METABOLIC PANEL  TROPONIN I   ____________________________________________  EKG  ED ECG REPORT I, Nita Sickle, the attending physician, personally viewed and interpreted this ECG.  Normal sinus rhythm, rate of 69, normal intervals, normal axis, no ST elevations or depressions. ____________________________________________  RADIOLOGY  CXR; Negative  ____________________________________________   PROCEDURES  Procedure(s) performed: None Procedures Critical Care performed:  None ____________________________________________   INITIAL IMPRESSION / ASSESSMENT AND PLAN / ED COURSE  36 y.o. female with a history of bipolar, schizophrenia, anemia, smoking who presents for evaluation of cough, wheezing, shortness of breath x 3 weeks since starting to use daily bleach. Patient is in no apparent distress, she is actively coughing, vital signs are within normal limits, normal work of breathing, normal sats, she does have slightly decreased air movement but no wheezes or  crackles. EKG with no ischemic changes. PERC negative for PE. Troponin is negative. Chest x-ray with no evidence of pneumonia. Patient received 2 DuoNeb treatments with resolution of her cough and shortness of breath. She is now complaining of one episode of vomiting and feeling very nauseated after the 2 DuoNeb treatments. We'll give IV fluids and Zofran and reassess.  presentation concerning for chemical pneumonitis.  Clinical Course as of Sep 15 652  Thu Sep 15, 2016  [id number] Patient remained stable, tolerating by mouth, normal vital signs, she is to be discharged home on prednisone, albuterol, recommended that she avoids using bleaching in the house. Recommended close follow-up with primary care doctor. Counseled patient on and smoke cessation.  [CV]    Clinical Course User Index [CV] Nita Sickle, MD    Pertinent labs & imaging results that were available during my care of the patient were reviewed by me and considered in my medical decision making (see chart for details).    ____________________________________________   FINAL CLINICAL IMPRESSION(S) / ED DIAGNOSES  Final diagnoses:  Acute pneumonitis due to chemical fumes (HCC)      NEW MEDICATIONS STARTED DURING THIS VISIT:  Discharge Medication List as of 09/15/2016  5:52 AM    START taking these medications   Details  albuterol (PROVENTIL HFA;VENTOLIN HFA) 108 (90 Base) MCG/ACT inhaler Inhale 2 puffs into the lungs every 6 (six) hours as needed for wheezing or shortness of breath., Starting Thu 09/15/2016, Print    predniSONE (DELTASONE) 20 MG tablet Take 3 tablets (60 mg total) by mouth daily., Starting Thu 09/15/2016, Until Mon 09/19/2016, Print         Note:  This document was prepared using Dragon voice recognition software and may include unintentional dictation errors.    Nita Sickle, MD 09/15/16 [phone]

## 2016-09-15 NOTE — ED Notes (Signed)

## 2016-09-23 ENCOUNTER — Ambulatory Visit: Payer: Self-pay | Admitting: Family

## 2016-09-25 ENCOUNTER — Other Ambulatory Visit: Payer: Self-pay | Admitting: Psychiatry

## 2016-09-26 ENCOUNTER — Telehealth: Payer: Self-pay

## 2016-09-26 NOTE — Telephone Encounter (Signed)
pt called left message on voice mail that she had to leave her home because it was unsafe and that she couldnt get anything out of her house. pt states she needs to get enough medication to last until appt 09-28-16

## 2016-09-26 NOTE — Telephone Encounter (Signed)
called pt asked what was going on and why she needed refill. [place] there roommate was using cocain and had a gun. so they staying in [place]. pt states she is going to find a doctor there because they dont have a car.  Pt states she going to cancel the appt for Wednesday because they have no way to get here.  Pt would like for you to send enough medication so she can find a doctor there.   [facility] [place]  At  [address].

## 2016-09-27 ENCOUNTER — Other Ambulatory Visit: Payer: Self-pay | Admitting: Psychiatry

## 2016-09-27 MED ORDER — ZIPRASIDONE HCL 40 MG PO CAPS: 40.0000 mg | ORAL_CAPSULE | Freq: Every day | ORAL | 1 refills | Status: AC

## 2016-09-27 MED ORDER — ESCITALOPRAM OXALATE 10 MG PO TABS: 15.0000 mg | ORAL_TABLET | Freq: Every day | ORAL | 1 refills | Status: AC

## 2016-09-27 MED ORDER — AMANTADINE HCL 100 MG PO CAPS: 100.0000 mg | ORAL_CAPSULE | Freq: Every day | ORAL | 1 refills | Status: AC

## 2016-09-27 MED ORDER — BUSPIRONE HCL 10 MG PO TABS: 10.0000 mg | ORAL_TABLET | Freq: Every morning | ORAL | 1 refills | Status: AC

## 2016-09-27 MED ORDER — TRAZODONE HCL 150 MG PO TABS: 150.0000 mg | ORAL_TABLET | Freq: Every day | ORAL | 1 refills | Status: AC

## 2016-09-27 NOTE — Telephone Encounter (Signed)
TC on 09-27-16 could not leave a message. No answer.

## 2016-09-27 NOTE — Telephone Encounter (Signed)
Called in 2 months supply of meds. Please advise pt that she will not be given any more refills unless she is seen in this office for follow up.

## 2016-09-27 NOTE — Telephone Encounter (Signed)
Called 09-27-16 no answer but was able to leave a message this time.  So a message was left that Dr. Garnetta BuddyFaheem sent 2 months supply in and that she would not be given anymore that that until she is seen.

## 2016-09-28 ENCOUNTER — Ambulatory Visit: Payer: Medicare Other | Admitting: Psychiatry

## 2016-10-12 ENCOUNTER — Other Ambulatory Visit: Payer: Self-pay | Admitting: Psychiatry

## 2016-11-03 ENCOUNTER — Ambulatory Visit: Payer: Self-pay | Admitting: Family

## 2016-11-26 ENCOUNTER — Other Ambulatory Visit: Payer: Self-pay | Admitting: Psychiatry

## 2016-12-13 ENCOUNTER — Other Ambulatory Visit: Payer: Self-pay | Admitting: Psychiatry

## 2017-03-25 ENCOUNTER — Other Ambulatory Visit: Payer: Self-pay | Admitting: Psychiatry

## 2017-06-03 ENCOUNTER — Other Ambulatory Visit: Payer: Self-pay | Admitting: Psychiatry

## 2017-10-11 IMAGING — CR DG CHEST 2V
2 series · 2 of 2 positions shown · non-contrast
Comparison: Chest radiograph dated 11/17/2014

CLINICAL DATA: 35-year-old female with cough.

EXAM:
CHEST  2 VIEW

[chest pa]
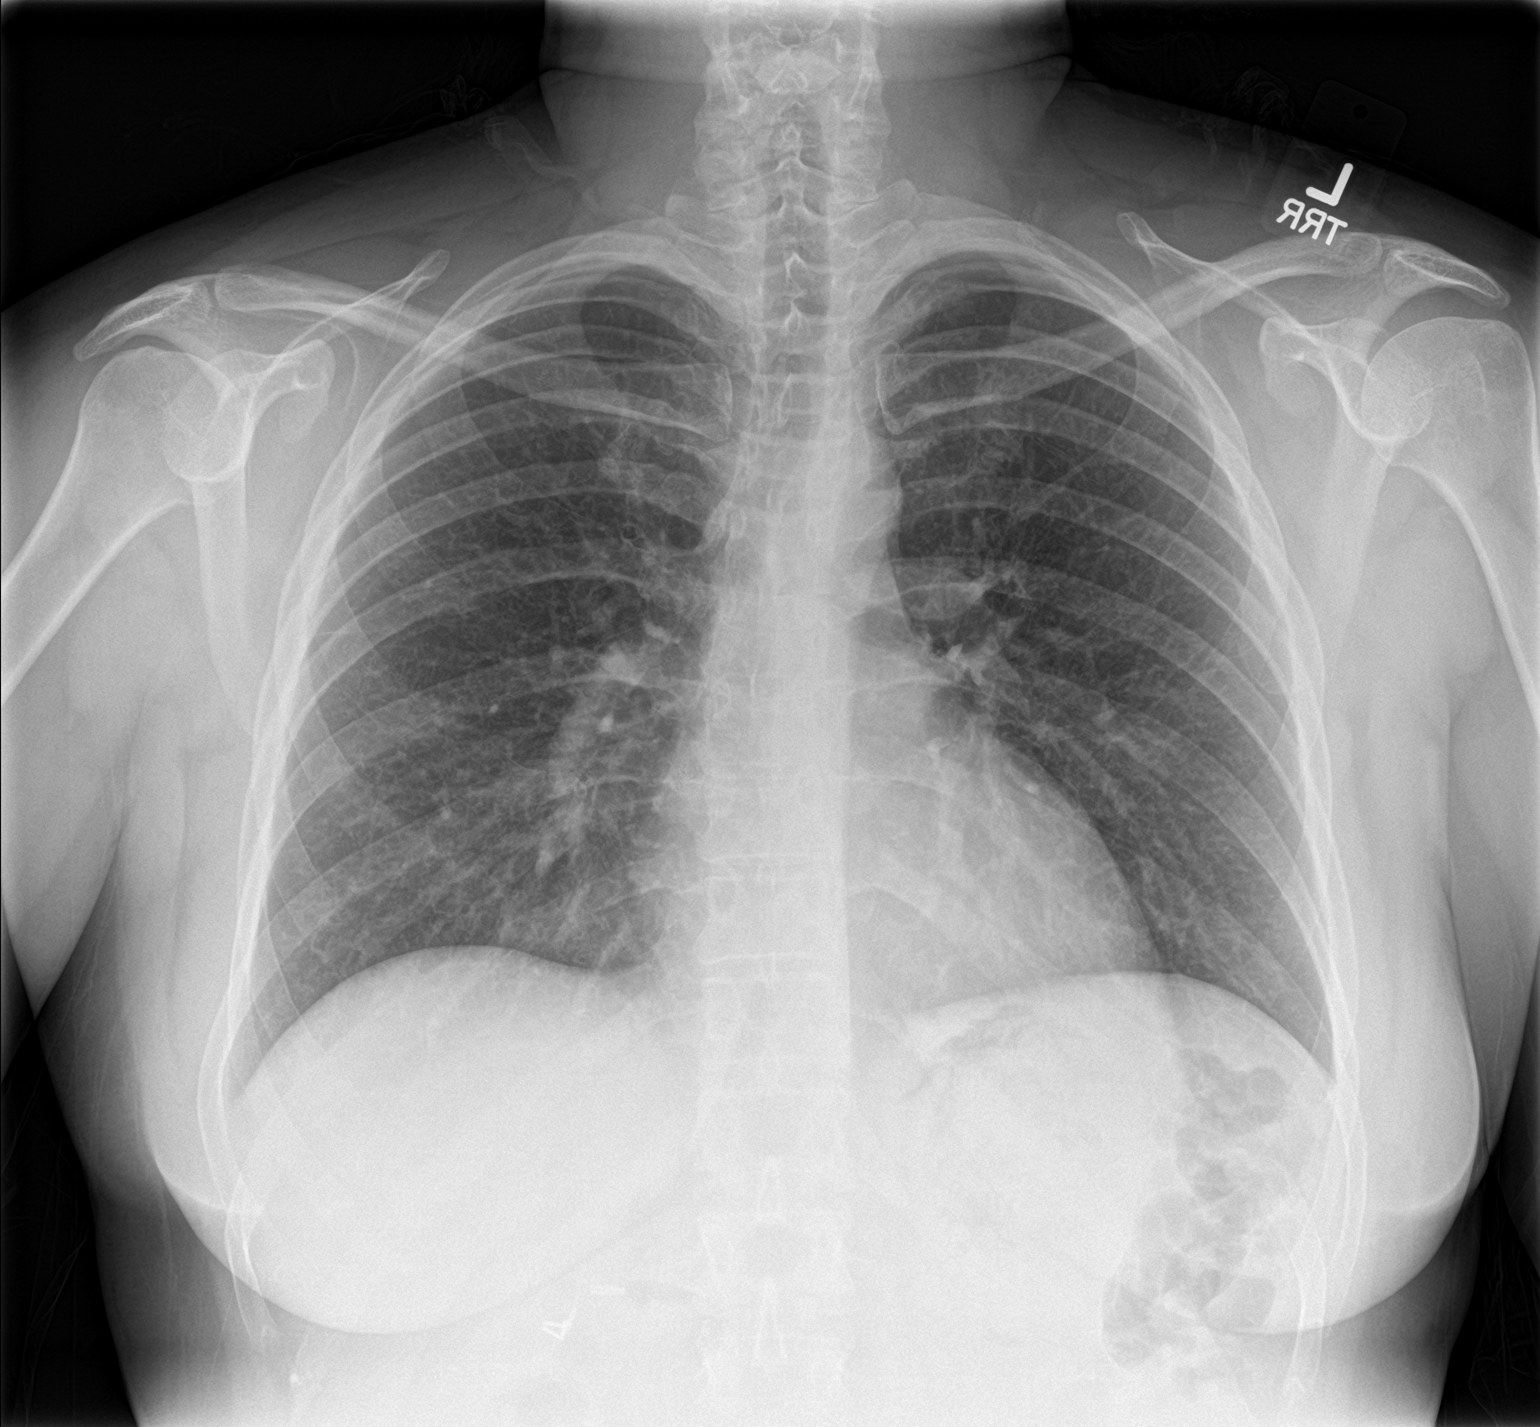

[chest lat]
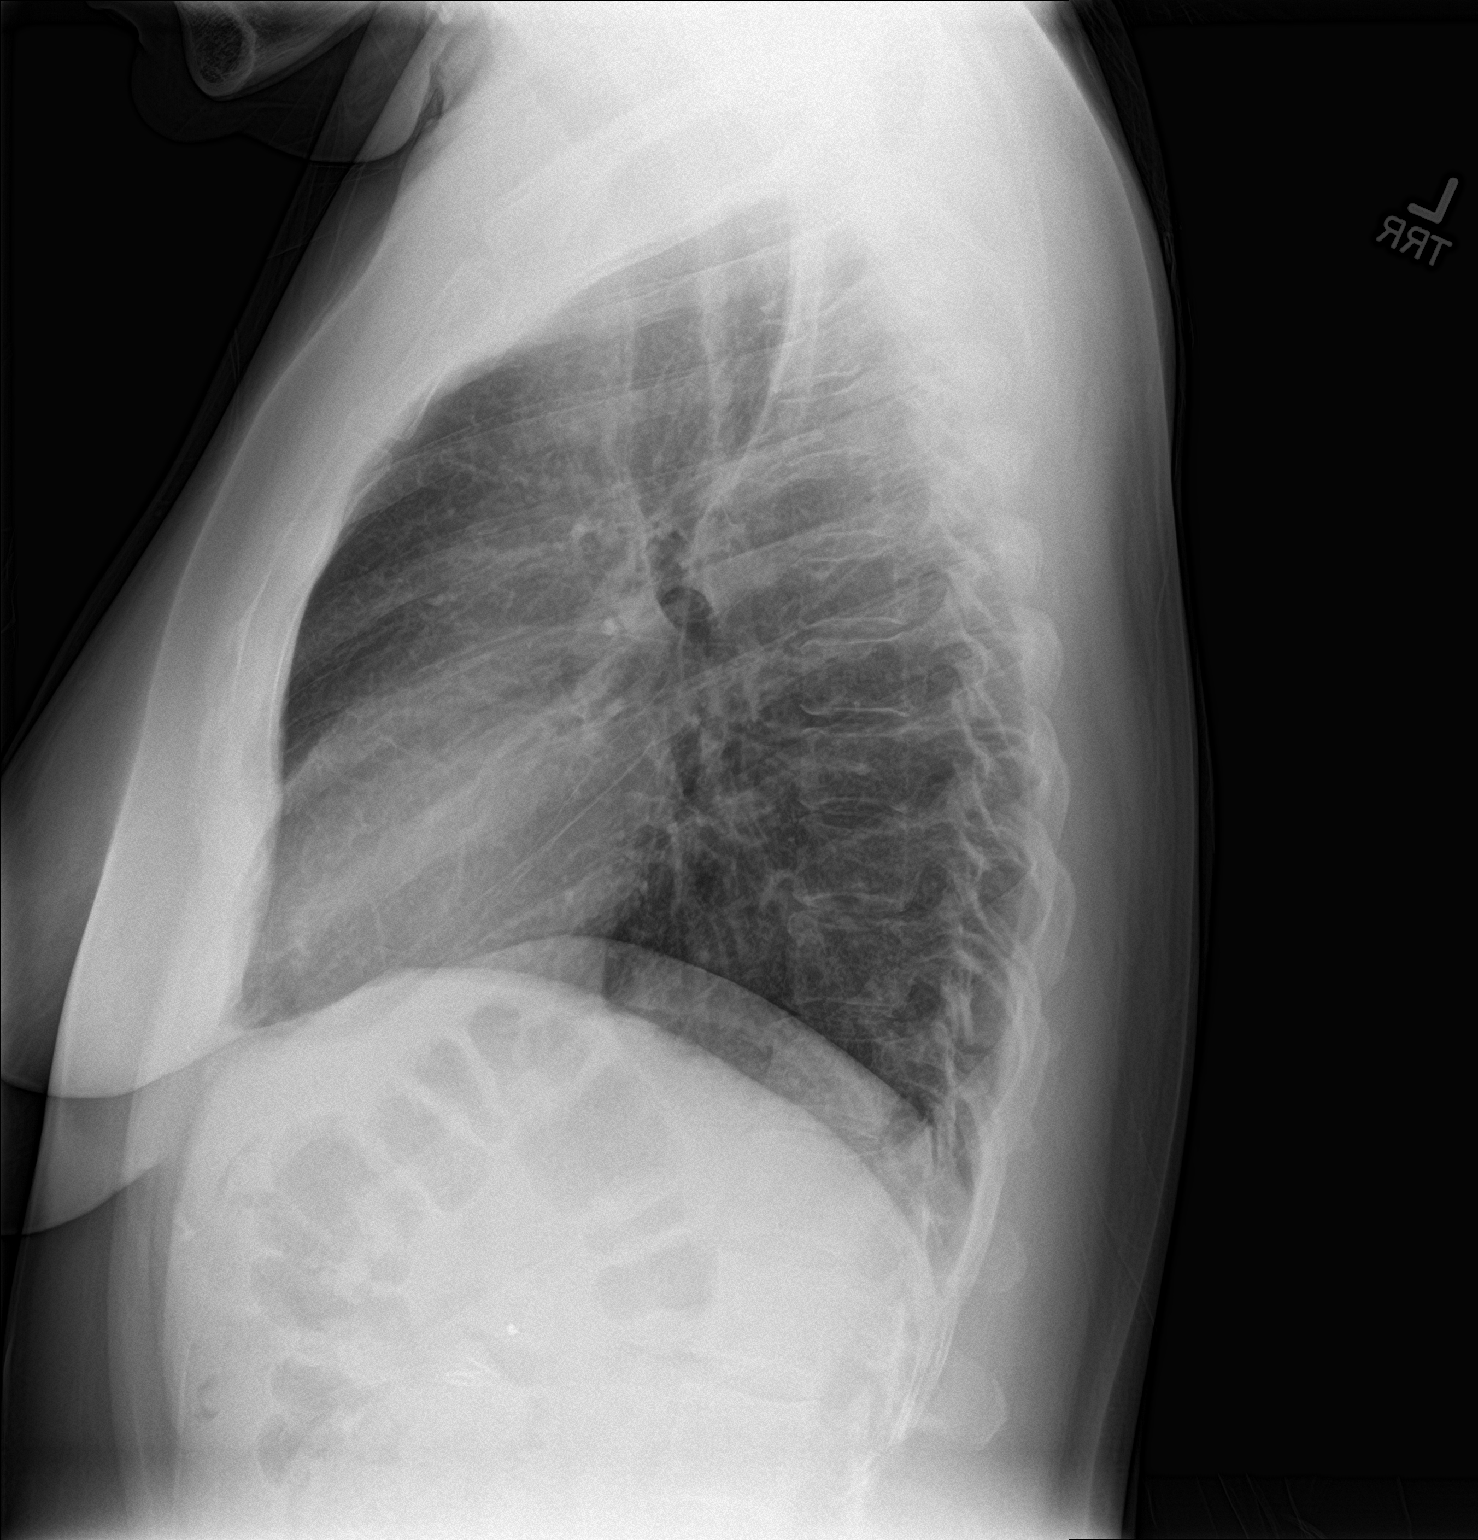

[2 of 2 positions shown; findings below may reference images not displayed]

FINDINGS: The heart size and mediastinal contours are within normal limits.
Both lungs are clear. The visualized skeletal structures are
unremarkable.
IMPRESSION: No active cardiopulmonary disease.
